# Patient Record
Sex: Female | Born: 1980 | Race: White | Marital: Married
Health system: Southern US, Community
[De-identification: ages and names within clinical notes are randomized; demographics above are authoritative.]

---

## 2017-09-28 ENCOUNTER — Ambulatory Visit: Payer: BC Managed Care – PPO | Admitting: Sports Medicine

## 2017-09-28 ENCOUNTER — Ambulatory Visit
Admission: RE | Admit: 2017-09-28 | Discharge: 2017-09-28 | Disposition: A | Discharge status: Home / Self Care | Payer: BC Managed Care – PPO | Source: Ambulatory Visit | Attending: Sports Medicine | Admitting: Sports Medicine

## 2017-09-28 VITALS — BP 122/78 | Ht 62.0 in | Wt 112.0 lb

## 2017-09-28 DIAGNOSIS — M7071 Other bursitis of hip, right hip: Secondary | ICD-10-CM

## 2017-09-28 DIAGNOSIS — M25551 Pain in right hip: Secondary | ICD-10-CM | POA: Diagnosis not present

## 2017-09-29 ENCOUNTER — Encounter: Payer: Self-pay | Admitting: Sports Medicine

## 2017-09-29 NOTE — Progress Notes (Signed)
   Subjective:    Patient ID: Melanie [name], female    DOB: 02/24/1981, 37 y.o.   MRN: [id number]  HPI chief complaint: Right hip pain  Very pleasant 37 year old female comes in today complaining of right hip pain. She is currently [facility]. While training, she began to notice some pain in the posterior lateral aspect of the right hip which she describes as a "pinching sensation". She received some massage therapy which was helpful but she then began to develop pain more along the lateral hip. Pain at times will radiate into her hamstring. She has been able to continue running. She denies pain in the groin. She denies numbness or tingling. No significant pain at rest but she does endorse pain when lying on her right side. She has had intermittent discomfort in the past which always resolved with massage therapy but this time her symptoms are persisting. No pain at rest.  Past medical history reviewed Medications reviewed Allergies reviewed    Review of Systems    as above Objective:   Physical Exam  Well-developed, fit appearing. No acute distress. Awake alert and oriented 3. Vital signs reviewed  Right hip: Smooth painless hip range of motion with a negative logroll. Negative FABER and negative FADIR. There is minimal tenderness to palpation over the greater trochanteric bursa. No tenderness along the piriformis. Mild weakness with resisted hip abduction on the right. Great strength with resisted hip abduction on the left. Hop testing does reproduce pain but it is along the lateral hip and not in the groin. Neurovascularly intact distally. Walking without a limp.  X-rays of the right hip are unremarkable. Specifically, no obvious stress fracture [facility] ultrasound of the right hip was performed. Limited images were obtained. There is some fluid just posterior to the greater trochanter likely consistent with mild bursitis.       Assessment & Plan:    Right hip pain secondary to bursitis  Patient will start IT band stretching, hip abductor strengthening, and icing after activity (she has already been doing this). I think she is okay to run her upcoming race using pain as her guide. She understands that her symptoms will not resolve prior to the race and she will need to take a little time off to cross train and work on strengthening her hip abductors [facility]. She may use low doses of ibuprofen after exercise as needed. X-ray is reassuring in regards to stress fracture but if symptoms persist or worsen then further diagnostic imaging may be necessary. Gait analysis was not performed today which should also be considered in the future if symptoms do not resolve. She will follow-up for ongoing or recalcitrant issues.

## 2018-04-19 ENCOUNTER — Other Ambulatory Visit: Payer: Self-pay

## 2018-04-19 ENCOUNTER — Ambulatory Visit: Payer: BC Managed Care – PPO | Attending: Family Medicine | Admitting: Physical Therapy

## 2018-04-19 ENCOUNTER — Encounter: Payer: Self-pay | Admitting: Physical Therapy

## 2018-04-19 DIAGNOSIS — M79604 Pain in right leg: Secondary | ICD-10-CM | POA: Diagnosis present

## 2018-04-19 NOTE — Therapy (Signed)
[place], [place], [address] Phone: [phone]   Fax:  [phone]  Physical Therapy Evaluation  Patient Details  Name: Melanie [name] MRN: [id number] Date of Birth: 11-12-1980 Referring Provider (PT): Rhina Brackett, MD   Encounter Date: 04/19/2018  PT End of Session - 04/19/18 2123    Visit Number  1    Number of Visits  4    Date for PT Re-Evaluation  06/14/18    Authorization Type  BCBS    PT Start Time  1324    PT Stop Time  1800    PT Time Calculation (min)  45 min    Activity Tolerance  Patient tolerated treatment well    Behavior During Therapy  [facility] for tasks assessed/performed       History reviewed. No pertinent past medical history.  History reviewed. No pertinent surgical history.  There were no vitals filed for this visit.   Subjective Assessment - 04/19/18 2106    Subjective  37 y/o female with c/o right proximal hamstring region pain. Pt. seen for PT earlier this year (for same issue along with gluteus medius region pain and some SI joint issues) for several visits beginning around last May-improved at the time but pt. reports new onset/exacerbation of symptoms about 1 1/2 months ago. Pain associated with running otherwise no mechanism of injury noted.    Limitations  Sitting;Walking   running   How long can you sit comfortably?  varies, <1 hour    How long can you stand comfortably?  no limitations    How long can you walk comfortably?  varies-pain with running > walking    Patient Stated Goals  Run in marathon this coming January    Currently in Pain?  No/denies   no pain at eval, reports pain 4-5/10 with running right proximal hamstring region        [facility] PT Assessment - 04/19/18 0001      Assessment   Medical Diagnosis  Right hamstring tendinopathy/strain    Referring Provider (PT)  Rhina Brackett, MD    Onset Date/Surgical Date  03/06/18    Hand Dominance  Right     Prior Therapy  --   past PT at another facility over last Sping/Summer     Precautions   Precautions  None      Restrictions   Weight Bearing Restrictions  No      Balance Screen   Has the patient fallen in the past 6 months  No      [place] residence    Living Arrangements  --   Lives with spouse, 1 flight of stairs in home     Prior Function   Level of Independence  Independent with basic ADLs    Vocation  Full time employment    Vocation Requirements  --   teacher     Cognition   Overall Cognitive Status  Within Functional Limits for tasks assessed      Observation/Other Assessments   Focus on Therapeutic Outcomes (FOTO)   34% limited      ROM / Strength   AROM / PROM / Strength  AROM;Strength      AROM   AROM Assessment Site  Knee    Right/Left Knee  Right;Left    Right Knee Extension  0    Right Knee Flexion  137    Left Knee Extension  0  Left Knee Flexion  140      Strength   Strength Assessment Site  Hip;Knee    Right/Left Hip  Right;Left    Right Hip Flexion  5/5    Right Hip Extension  4+/5    Right Hip External Rotation   5/5    Right Hip Internal Rotation  5/5    Right Hip ABduction  4+/5    Left Hip Flexion  5/5    Left Hip Extension  5/5    Left Hip External Rotation  5/5    Left Hip Internal Rotation  5/5    Left Hip ABduction  5/5    Right/Left Knee  Right;Left    Right Knee Flexion  5/5    Right Knee Extension  5/5    Left Knee Flexion  5/5    Left Knee Extension  5/5      Flexibility   Soft Tissue Assessment /Muscle Length  --   Mild hamstring tightness with SLR 80 deg on right     Special Tests   Other special tests  --   [place] and longsitting (+) right ant. innominate rotation               Objective measurements completed on examination: See above findings.      [facility] Adult PT Treatment/Exercise - 04/19/18 0001      Exercises   Exercises  --   brief HEP  instruction prone eccentric, hamstring stretch     Modalities   Modalities  Electrical Stimulation      Electrical Stimulation   Electrical Stimulation Location  right hamstring    Electrical Stimulation Action  TENS   applied via needles   Electrical Stimulation Parameters  2 pps    Electrical Stimulation Goals  Tone;Pain      Manual Therapy   Manual Therapy  Soft tissue mobilization;Muscle Energy Technique    Soft tissue mobilization  STM right hamstring    Muscle Energy Technique  for right anterior innominate rotation       Trigger Point Dry Needling - 04/19/18 2121    Consent Given?  Yes    Education Handout Provided  --   no handout-pt. 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    Time  4    Period  Weeks    Status  New        PT Long Term Goals - 04/19/18 2132      PT LONG TERM GOAL #1   Title  Right hip strength 5/5 for improved ability squatting motions, improved hip stability for improved walking/running tolerance    Time  8    Period  Weeks  Status  New      PT LONG TERM GOAL #2   Title  Improve FOTO to 23% or less impairment    Baseline  34% limited    Time  8    Period  Weeks      PT LONG TERM GOAL #3   Title  Decrease hamstring pain with walking, running 75% or greater to improve tolerance for community ambulation and fitness activities    Time  8    Period   Weeks             Plan - 04/19/18 2124    Clinical Impression Statement  Pt. presents with right proximal hamstring pain consistent with referring dx. hamstring tendinopathy with associated muscle tightness, posterior chain weakness. Pt. also with right anterior innominate rotation which suspect could be contributing factor to ongoing hamstring issues. Pt. would benefit from PT to address current associated functional limitations for mobility.    History and Personal Factors relevant to plan of care:  previous history similar symptoms    Clinical Presentation  Stable    Clinical Decision Making  Low    Rehab Potential  Good    Clinical Impairments Affecting Rehab Potential  previous history symptoms/past tx.     PT Frequency  --   6 visits   PT Duration  8 weeks    PT Treatment/Interventions  ADLs/Self Care Home Management;Cryotherapy;Electrical Stimulation;Ultrasound;Moist Heat;Iontophoresis 6m/ml Dexamethasone;Gait training;Functional mobility training;Therapeutic activities;Patient/family education;Neuromuscular re-education;Manual techniques;Dry needling;Taping    PT Next Visit Plan  for exercises progress eccentrics, gentle hamstring stretches, STM, METs prn for innominate rotation, dry needling, modalities prn    PT Home Exercise Plan  prone hamstring eccentric, hamstring stretch, seated isometric       Patient will benefit from skilled therapeutic intervention in order to improve the following deficits and impairments:  Impaired flexibility, Difficulty walking, Decreased strength, Decreased activity tolerance, Pain, Impaired tone, Decreased endurance  Visit Diagnosis: Pain in right leg     Problem List There are no active problems to display for this patient.   CBeaulah Dinning PT, DPT 04/19/18 9:39 PM  [facility] [address]Phone: [phone]  Fax:  [phone] Name: Melanie RanaMRN: [id number] of Birth: 4January 30, 1982

## 2018-05-10 ENCOUNTER — Ambulatory Visit: Payer: BC Managed Care – PPO | Attending: Family Medicine | Admitting: Physical Therapy

## 2018-05-10 ENCOUNTER — Encounter: Payer: Self-pay | Admitting: Physical Therapy

## 2018-05-10 DIAGNOSIS — M79604 Pain in right leg: Secondary | ICD-10-CM

## 2018-05-10 NOTE — Therapy (Signed)
[facility] [address] [place], [place], [address] Phone: [phone]   Fax:  [phone]  Physical Therapy Treatment  Patient Details  Name: Melanie [name] MRN: [id number] Date of Birth: 03/06/1981 Referring Provider (PT): Eula Listenominic McKinley, MD   Encounter Date: 05/10/2018  PT End of Session - 05/10/18 1745    Visit Number  2    Number of Visits  4    Date for PT Re-Evaluation  06/14/18    Authorization Type  BCBS    PT Start Time  1717    PT Stop Time  1802    PT Time Calculation (min)  45 min    Activity Tolerance  Patient tolerated treatment well    Behavior During Therapy  [facility] for tasks assessed/performed       History reviewed. No pertinent past medical history.  History reviewed. No pertinent surgical history.  There were no vitals filed for this visit.  Subjective Assessment - 05/10/18 1743    Subjective  Pt. returns for first follow up since initial eval. She reports tx. at eval helped but ran half marathon with some subsequent symptom exacerbation though pain more with sitting than running afterward.    Currently in Pain?  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                       [facility] Adult PT Treatment/Exercise - 05/10/18 0001      Exercises   Exercises  Knee/Hip      Knee/Hip Exercises: Stretches   Passive Hamstring Stretch  Right;3 reps;30 seconds      Knee/Hip Exercises: Supine   Bridges with Ball Squeeze  20 reps   pillow squeeze instead of ball   Knee Flexion  Both;1 set;20 reps   with 65 cm  P-ball   Other Supine Knee/Hip Exercises  supine manual piriformis stretch 3x30 sec      Electrical Stimulation   Electrical Stimulation Location  right hamstring    Electrical Stimulation Action  --   TENS 100 pps applied via needles   Electrical Stimulation Parameters  100 pps    Electrical Stimulation Goals  Tone;Pain      Manual Therapy   Manual Therapy  Soft tissue mobilization;Muscle Energy Technique    Soft tissue mobilization  STM righ hamstring    Muscle Energy Technique  for right anterior innominate rotation 3x5       Trigger Point Dry Needling - 05/10/18 1806    Consent Given?  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    Time  4    Period  Weeks    Status  New        PT Long Term Goals - 04/19/18 2132      PT LONG TERM GOAL #1   Title  Right hip strength 5/5 for improved ability squatting motions, improved hip stability for improved walking/running tolerance    Time  8    Period  Weeks    Status  New      PT LONG TERM GOAL #2   Title  Improve FOTO to 23% or less impairment    Baseline  34% limited    Time  8    Period  Weeks      PT LONG TERM GOAL #3   Title  Decrease hamstring pain with walking, running 75% or greater to improve tolerance for community ambulation and fitness activities    Time  8    Period  Weeks            Plan - 05/10/18 1747    Clinical Impression  Statement  Good response to initial tx. but some setback as noted in subjective with symptom exacerbation. Presentation consistent with hamstring tendinopathy-significant hamstring tightness noted today. P.t would benefit from continued PT for further progress to address functional mobility limitations.    PT Frequency  --   6 visits   PT Duration  8 weeks    PT Treatment/Interventions  ADLs/Self Care Home Management;Cryotherapy;Electrical Stimulation;Ultrasound;Moist Heat;Iontophoresis 4mg /ml Dexamethasone;Gait training;Functional mobility training;Therapeutic activities;Patient/family education;Neuromuscular re-education;Manual techniques;Dry needling;Taping    PT Next Visit Plan  for exercises progress eccentrics, gentle hamstring stretches, STM, METs prn for innominate rotation, dry needling, modalities prn    PT Home Exercise Plan  prone hamstring eccentric, hamstring stretch, seated isometric       Patient will benefit from skilled therapeutic intervention in order to improve the following deficits and impairments:  Impaired flexibility, Difficulty walking, Decreased strength, Decreased activity tolerance, Pain, Impaired tone, Decreased endurance  Visit Diagnosis: Pain in right leg     Problem List There are no active problems to display for this patient.   Lazarus Gowda, PT, DPT 05/10/18 6:11 PM  [facility] [address] [place], [place], [address] Phone: [phone]   Fax:  [phone]  Name: Melanie [name] MRN: [id number] Date of Birth: Jun 12, 1981

## 2018-05-24 ENCOUNTER — Ambulatory Visit: Payer: BC Managed Care – PPO | Attending: Family Medicine | Admitting: Physical Therapy

## 2018-05-24 ENCOUNTER — Encounter: Payer: Self-pay | Admitting: Physical Therapy

## 2018-05-24 DIAGNOSIS — M79604 Pain in right leg: Secondary | ICD-10-CM | POA: Diagnosis not present

## 2018-05-24 NOTE — Therapy (Signed)
[place], [place], [address] Phone: [phone]   Fax:  [phone]  Physical Therapy Treatment  Patient Details  Name: Melanie [name] MRN: [id number] Date of Birth: 13-Aug-1980 Referring Provider (PT): Rhina Brackett, MD   Encounter Date: 05/24/2018  PT End of Session - 05/24/18 2058    Visit Number  3    Number of Visits  4    Date for PT Re-Evaluation  06/14/18    Authorization Type  BCBS    PT Start Time  1722    PT Stop Time  1803    PT Time Calculation (min)  41 min    Activity Tolerance  Patient tolerated treatment well    Behavior During Therapy  [facility] for tasks assessed/performed       History reviewed. No pertinent past medical history.  History reviewed. No pertinent surgical history.  There were no vitals filed for this visit.  Subjective Assessment - 05/24/18 2054    Subjective  Had some soreness over the weekend with running but feels tx. helping, no pain pre-tx.    Currently in Pain?  No/denies    Pain Score  0-No pain         [facility] PT Assessment - 05/24/18 0001      Strength   Right Hip Flexion  5/5    Right Hip Extension  4+/5    Right Hip External Rotation   5/5    Right Hip Internal Rotation  5/5    Right Hip ABduction  5/5    Left Hip Flexion  5/5    Left Hip Extension  5/5    Left Hip External Rotation  5/5    Left Hip Internal Rotation  5/5    Left Hip ABduction  5/5    Right Knee Flexion  5/5    Right Knee Extension  5/5    Left Knee Flexion  5/5    Left Knee Extension  5/5                   [facility] Adult PT Treatment/Exercise - 05/24/18 0001      Knee/Hip Exercises: Stretches   Passive Hamstring Stretch  Right;3 reps;30 seconds      Knee/Hip Exercises: Supine   Bridges with [employer]  Both;20 reps    Other Supine Knee/Hip Exercises  clamshell 2x10 blue Theraband      Acupuncturist Location  --   Right  hamstring   Electrical Stimulation Action  TENS applied via needles    Electrical Stimulation Parameters  100 pps x 10 min    Electrical Stimulation Goals  Tone;Pain      Manual Therapy   Manual Therapy  Soft tissue mobilization;Muscle Energy Technique    Soft tissue mobilization  STM right hamstring    Muscle Energy Technique  for right anterior innominate rotation correction       Trigger Point Dry Needling - 05/24/18 2055    Consent Given?  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    Baseline  symptoms variable but progressing    Time  4    Period  Weeks    Status  On-going        PT Long Term Goals - 05/24/18 2102      PT LONG TERM GOAL #1   Title  Right hip strength 5/5 for improved ability squatting motions, improved hip stability for improved walking/running tolerance    Baseline  ext 4+/5 otherwise met    Time  8    Period  Weeks    Status  Partially Met      PT LONG TERM GOAL #2   Title  Improve FOTO to 23% or less impairment    Baseline  34% limited at eval, not retested    Time  8    Period  Weeks      PT LONG TERM GOAL #3   Title  Decrease hamstring pain with walking, running 75% or greater to improve tolerance for community ambulation and fitness activities    Baseline  ongoing    Time  8    Period  Weeks    Status  On-going            Plan - 05/24/18 2058    Clinical Impression  Statement  Continues with innominate rotation and intermittent proximal hamstring pain consistent with tendinopathy and hamstring origin but pain and functional status for mobility improving from baseline.    PT Frequency  --   6 visits   PT Duration  8 weeks    PT Treatment/Interventions  ADLs/Self Care Home Management;Cryotherapy;Electrical Stimulation;Ultrasound;Moist Heat;Iontophoresis 62m/ml Dexamethasone;Gait training;Functional mobility training;Therapeutic activities;Patient/family education;Neuromuscular re-education;Manual techniques;Dry needling;Taping    PT Next Visit Plan  for exercises progress eccentrics, gentle hamstring stretches, STM, METs prn for innominate rotation, dry needling, modalities prn    PT Home Exercise Plan  prone hamstring eccentric, hamstring stretch, seated isometric       Patient will benefit from skilled therapeutic intervention in order to improve the following deficits and impairments:  Impaired flexibility, Difficulty walking, Decreased strength, Decreased activity tolerance, Pain, Impaired tone, Decreased endurance  Visit Diagnosis: Pain in right leg     Problem List There are no active problems to display for this patient.   CBeaulah Dinning PT, DPT 05/24/18 9:03 PM  [facility] [address]Phone: [phone]  Fax:  [phone] Name: Melanie GillihanMRN: [id number] of Birth: 408/11/1980

## 2018-06-07 ENCOUNTER — Encounter: Payer: Self-pay | Admitting: Physical Therapy

## 2018-06-07 ENCOUNTER — Ambulatory Visit: Payer: BC Managed Care – PPO | Admitting: Physical Therapy

## 2018-06-07 DIAGNOSIS — M79604 Pain in right leg: Secondary | ICD-10-CM

## 2018-06-07 NOTE — Therapy (Signed)
[place], [place], [address] Phone: [phone]   Fax:  [phone]  Physical Therapy Treatment  Patient Details  Name: Melanie [name] MRN: [id number] Date of Birth: [birth year] Referring Provider (PT): Rhina Brackett, MD   Encounter Date: 06/07/2018  PT End of Session - 06/07/18 2053    Visit Number  4    Number of Visits  6    Date for PT Re-Evaluation  06/14/18    Authorization Type  [employer]    PT Start Time  1717    PT Stop Time  1806    PT Time Calculation (min)  49 min    Activity Tolerance  Patient tolerated treatment well    Behavior During Therapy  [facility] for tasks assessed/performed       History reviewed. No pertinent past medical history.  History reviewed. No pertinent surgical history.  There were no vitals filed for this visit.  Subjective Assessment - 06/07/18 2044    Subjective  Pt. reports right hamstring is improving though still notes soreness with prolonged running or sitting. Pt. has noted some right hip flexor region pain with running since last week. No overt mechanism of onset noted.    Currently in Pain?  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, hospital, difficulty running for fitness         [facility] PT Assessment - 06/07/18 0001      Observation/Other Assessments   Focus on Therapeutic Outcomes (FOTO)   25% limited      Strength   Right Hip Flexion  5/5    Right Hip Extension  5/5    Right Hip External Rotation   5/5    Right Hip Internal Rotation  5/5    Right Hip ABduction   5/5    Left Hip Flexion  5/5    Left Hip Extension  5/5    Left Hip External Rotation  5/5    Left Hip Internal Rotation  5/5    Left Hip ABduction  5/5    Right Knee Flexion  5/5    Right Knee Extension  5/5    Left Knee Flexion  5/5    Left Knee Extension  5/5                   [facility] Adult PT Treatment/Exercise - 06/07/18 0001      Knee/Hip Exercises: Stretches   Passive Hamstring Stretch  Right;3 reps;30 seconds    Hip Flexor Stretch  Right;3 reps;30 seconds      Knee/Hip Exercises: Supine   Hip Adduction Isometric  20 reps    Other Supine Knee/Hip Exercises  clamshell blue x 20      Electrical Stimulation   Electrical Stimulation Location  Right hamstring    Electrical Stimulation Action  TENS   applied via needles   Electrical Stimulation Parameters  100 pps x 10 min    Electrical Stimulation Goals  Tone;Pain  Manual Therapy   Manual Therapy  Soft tissue mobilization;Muscle Energy Technique    Soft tissue mobilization  STM right hamstring    Muscle Energy Technique  for right anterior innominate rotation 5x2 for 5 sec holds             PT Education - 06/07/18 2052    Education Details  POC, hip/muscle anatomy    Person(s) Educated  Patient    Methods  Explanation;Demonstration    Comprehension  Verbalized understanding       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    Baseline  met    Time  4    Period  Weeks    Status  Achieved        PT Long Term Goals - 06/07/18 2057      PT LONG TERM GOAL #1   Title  Right hip strength 5/5 for improved ability squatting motions, improved hip stability for improved walking/running tolerance    Baseline  5/5    Time  8    Period   Weeks    Status  Achieved      PT LONG TERM GOAL #2   Title  Improve FOTO to 23% or less impairment    Baseline  25% limited    Time  8    Period  Weeks    Status  Achieved      PT LONG TERM GOAL #3   Title  Decrease hamstring pain with walking, running 75% or greater to improve tolerance for community ambulation and fitness activities    Time  8    Period  Weeks    Status  On-going            Plan - 06/07/18 2053    Clinical Impression Statement  Improving with hamstring pain and associated functional gains for sitting tolerance and activities such as running. 9% improvement in FOTO score from baseline. Continues with innominate rotation (tendency right anterior innominate) and today presents with hip flexor region pain consistent with muscle strain. Per discusssion with pt. she will see how things go after today with HEP and return if needed pending future symptoms after giving HEP a few weeks.    Clinical Presentation  Evolving    Clinical Presentation due to:  new onset anterior hip pain symptoms    Clinical Decision Making  Low    Rehab Potential  Good    Clinical Impairments Affecting Rehab Potential  previous history symptoms/past tx.     PT Frequency  --   6 visits   PT Duration  8 weeks    PT Treatment/Interventions  ADLs/Self Care Home Management;Cryotherapy;Electrical Stimulation;Ultrasound;Moist Heat;Iontophoresis 63m/ml Dexamethasone;Gait training;Functional mobility training;Therapeutic activities;Patient/family education;Neuromuscular re-education;Manual techniques;Dry needling;Taping    PT Next Visit Plan  for exercises progress eccentrics, gentle hamstring stretches, STM, METs prn for innominate rotation, dry needling, modalities prn    PT Home Exercise Plan  prone hamstring eccentric, hamstring stretch, seated isometric, hip flexor stretch    Consulted and Agree with Plan of Care  Patient  Patient will benefit from skilled therapeutic intervention in  order to improve the following deficits and impairments:  Impaired flexibility, Difficulty walking, Decreased strength, Decreased activity tolerance, Pain, Impaired tone, Decreased endurance  Visit Diagnosis: Pain in right leg     Problem List There are no active problems to display for this patient.   Beaulah Dinning, PT, DPT 06/07/18 8:59 PM  [facility] [address] [place], [place], [address] Phone: [phone]   Fax:  [phone]  Name: Melanie [name] MRN: [id number] Date of Birth: 1981-04-17

## 2018-06-24 DIAGNOSIS — Z0000 Encounter for general adult medical examination without abnormal findings: Secondary | ICD-10-CM | POA: Diagnosis not present

## 2018-06-24 DIAGNOSIS — Z131 Encounter for screening for diabetes mellitus: Secondary | ICD-10-CM | POA: Diagnosis not present

## 2018-06-24 DIAGNOSIS — Z3041 Encounter for surveillance of contraceptive pills: Secondary | ICD-10-CM | POA: Diagnosis not present

## 2018-06-24 DIAGNOSIS — Z1151 Encounter for screening for human papillomavirus (HPV): Secondary | ICD-10-CM | POA: Diagnosis not present

## 2018-06-24 DIAGNOSIS — Z130 Encounter for screening for diseases of the blood and blood-forming organs and certain disorders involving the immune mechanism: Secondary | ICD-10-CM | POA: Diagnosis not present

## 2018-06-24 DIAGNOSIS — Z1329 Encounter for screening for other suspected endocrine disorder: Secondary | ICD-10-CM | POA: Diagnosis not present

## 2018-06-24 DIAGNOSIS — Z13220 Encounter for screening for lipoid disorders: Secondary | ICD-10-CM | POA: Diagnosis not present

## 2018-06-24 DIAGNOSIS — Z01419 Encounter for gynecological examination (general) (routine) without abnormal findings: Secondary | ICD-10-CM | POA: Diagnosis not present

## 2018-06-24 DIAGNOSIS — Z6821 Body mass index (BMI) 21.0-21.9, adult: Secondary | ICD-10-CM | POA: Diagnosis not present

## 2018-06-24 DIAGNOSIS — Z113 Encounter for screening for infections with a predominantly sexual mode of transmission: Secondary | ICD-10-CM | POA: Diagnosis not present

## 2018-07-12 NOTE — Therapy (Signed)
[place], [place], [address] Phone: [phone]   Fax:  [phone]  Physical Therapy Treatment/Discharge Summary  Patient Details  Name: Melanie [name] MRN: [id number] Date of Birth: Mar 25, 1981 Referring Provider (PT): Rhina Brackett, MD   Encounter Date: 06/07/2018    History reviewed. No pertinent past medical history.  History reviewed. No pertinent surgical history.  There were no vitals filed for this visit.                              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    Baseline  met    Time  4    Period  Weeks    Status  Achieved        PT Long Term Goals - 06/07/18 2057      PT LONG TERM GOAL #1   Title  Right hip strength 5/5 for improved ability squatting motions, improved hip stability for improved walking/running tolerance    Baseline  5/5    Time  8    Period  Weeks    Status  Achieved      PT LONG TERM GOAL #2   Title  Improve FOTO to 23% or less impairment    Baseline  25% limited    Time  8    Period  Weeks    Status  Achieved      PT LONG TERM GOAL #3   Title  Decrease hamstring pain with walking, running 75% or greater to improve tolerance for community ambulation and fitness activities    Time  8    Period  Weeks    Status  On-going              Patient will benefit from skilled therapeutic intervention in order to improve the following deficits and impairments:  Impaired flexibility, Difficulty walking, Decreased strength, Decreased activity tolerance, Pain, Impaired tone, Decreased endurance  Visit Diagnosis: Pain in right leg     Problem List There  are no active problems to display for this patient.      PHYSICAL THERAPY DISCHARGE SUMMARY  Visits from Start of Care: 4  Current functional level related to goals / functional outcomes: Patient did not return after last visit 06/07/18 for further therapy.   Remaining deficits: Unknown   Education / Equipment: NA Plan:                                                    Patient goals were not met. Patient is being discharged due to not returning since the last visit.  ?????          Beaulah Dinning, PT, DPT 07/12/18 1:41 PM     [facility] [address] [place], [place], [address] Phone: [phone]   Fax:  [phone]  Name: Ander Purpura  Dace MRN: [id number] Date of Birth: 08-16-1980

## 2019-02-08 DIAGNOSIS — M7611 Psoas tendinitis, right hip: Secondary | ICD-10-CM | POA: Diagnosis not present

## 2019-02-08 DIAGNOSIS — M9904 Segmental and somatic dysfunction of sacral region: Secondary | ICD-10-CM | POA: Diagnosis not present

## 2019-02-08 DIAGNOSIS — M9905 Segmental and somatic dysfunction of pelvic region: Secondary | ICD-10-CM | POA: Diagnosis not present

## 2019-02-08 DIAGNOSIS — M9903 Segmental and somatic dysfunction of lumbar region: Secondary | ICD-10-CM | POA: Diagnosis not present

## 2019-02-14 DIAGNOSIS — M9903 Segmental and somatic dysfunction of lumbar region: Secondary | ICD-10-CM | POA: Diagnosis not present

## 2019-02-14 DIAGNOSIS — M7611 Psoas tendinitis, right hip: Secondary | ICD-10-CM | POA: Diagnosis not present

## 2019-02-14 DIAGNOSIS — M9904 Segmental and somatic dysfunction of sacral region: Secondary | ICD-10-CM | POA: Diagnosis not present

## 2019-02-14 DIAGNOSIS — M9905 Segmental and somatic dysfunction of pelvic region: Secondary | ICD-10-CM | POA: Diagnosis not present

## 2019-02-21 DIAGNOSIS — M9905 Segmental and somatic dysfunction of pelvic region: Secondary | ICD-10-CM | POA: Diagnosis not present

## 2019-02-21 DIAGNOSIS — M9903 Segmental and somatic dysfunction of lumbar region: Secondary | ICD-10-CM | POA: Diagnosis not present

## 2019-02-21 DIAGNOSIS — M9904 Segmental and somatic dysfunction of sacral region: Secondary | ICD-10-CM | POA: Diagnosis not present

## 2019-02-21 DIAGNOSIS — M7611 Psoas tendinitis, right hip: Secondary | ICD-10-CM | POA: Diagnosis not present

## 2019-03-01 DIAGNOSIS — M9904 Segmental and somatic dysfunction of sacral region: Secondary | ICD-10-CM | POA: Diagnosis not present

## 2019-03-01 DIAGNOSIS — M9903 Segmental and somatic dysfunction of lumbar region: Secondary | ICD-10-CM | POA: Diagnosis not present

## 2019-03-01 DIAGNOSIS — M7611 Psoas tendinitis, right hip: Secondary | ICD-10-CM | POA: Diagnosis not present

## 2019-03-01 DIAGNOSIS — M9905 Segmental and somatic dysfunction of pelvic region: Secondary | ICD-10-CM | POA: Diagnosis not present

## 2019-03-13 DIAGNOSIS — M9903 Segmental and somatic dysfunction of lumbar region: Secondary | ICD-10-CM | POA: Diagnosis not present

## 2019-03-13 DIAGNOSIS — M7611 Psoas tendinitis, right hip: Secondary | ICD-10-CM | POA: Diagnosis not present

## 2019-03-13 DIAGNOSIS — M9905 Segmental and somatic dysfunction of pelvic region: Secondary | ICD-10-CM | POA: Diagnosis not present

## 2019-03-13 DIAGNOSIS — M9904 Segmental and somatic dysfunction of sacral region: Secondary | ICD-10-CM | POA: Diagnosis not present

## 2019-03-28 DIAGNOSIS — M9904 Segmental and somatic dysfunction of sacral region: Secondary | ICD-10-CM | POA: Diagnosis not present

## 2019-03-28 DIAGNOSIS — M7611 Psoas tendinitis, right hip: Secondary | ICD-10-CM | POA: Diagnosis not present

## 2019-03-28 DIAGNOSIS — M9903 Segmental and somatic dysfunction of lumbar region: Secondary | ICD-10-CM | POA: Diagnosis not present

## 2019-03-28 DIAGNOSIS — M9905 Segmental and somatic dysfunction of pelvic region: Secondary | ICD-10-CM | POA: Diagnosis not present

## 2019-04-18 DIAGNOSIS — M9905 Segmental and somatic dysfunction of pelvic region: Secondary | ICD-10-CM | POA: Diagnosis not present

## 2019-04-18 DIAGNOSIS — M9904 Segmental and somatic dysfunction of sacral region: Secondary | ICD-10-CM | POA: Diagnosis not present

## 2019-04-18 DIAGNOSIS — M7611 Psoas tendinitis, right hip: Secondary | ICD-10-CM | POA: Diagnosis not present

## 2019-04-18 DIAGNOSIS — M9903 Segmental and somatic dysfunction of lumbar region: Secondary | ICD-10-CM | POA: Diagnosis not present

## 2019-05-09 DIAGNOSIS — M9904 Segmental and somatic dysfunction of sacral region: Secondary | ICD-10-CM | POA: Diagnosis not present

## 2019-05-09 DIAGNOSIS — M7611 Psoas tendinitis, right hip: Secondary | ICD-10-CM | POA: Diagnosis not present

## 2019-05-09 DIAGNOSIS — M9905 Segmental and somatic dysfunction of pelvic region: Secondary | ICD-10-CM | POA: Diagnosis not present

## 2019-05-09 DIAGNOSIS — M9903 Segmental and somatic dysfunction of lumbar region: Secondary | ICD-10-CM | POA: Diagnosis not present

## 2019-06-08 DIAGNOSIS — M9904 Segmental and somatic dysfunction of sacral region: Secondary | ICD-10-CM | POA: Diagnosis not present

## 2019-06-08 DIAGNOSIS — M9903 Segmental and somatic dysfunction of lumbar region: Secondary | ICD-10-CM | POA: Diagnosis not present

## 2019-06-08 DIAGNOSIS — M7611 Psoas tendinitis, right hip: Secondary | ICD-10-CM | POA: Diagnosis not present

## 2019-06-08 DIAGNOSIS — M9905 Segmental and somatic dysfunction of pelvic region: Secondary | ICD-10-CM | POA: Diagnosis not present

## 2019-07-04 DIAGNOSIS — Z20828 Contact with and (suspected) exposure to other viral communicable diseases: Secondary | ICD-10-CM | POA: Diagnosis not present

## 2019-07-18 DIAGNOSIS — M9903 Segmental and somatic dysfunction of lumbar region: Secondary | ICD-10-CM | POA: Diagnosis not present

## 2019-07-18 DIAGNOSIS — M9905 Segmental and somatic dysfunction of pelvic region: Secondary | ICD-10-CM | POA: Diagnosis not present

## 2019-07-18 DIAGNOSIS — M9904 Segmental and somatic dysfunction of sacral region: Secondary | ICD-10-CM | POA: Diagnosis not present

## 2019-07-18 DIAGNOSIS — M7611 Psoas tendinitis, right hip: Secondary | ICD-10-CM | POA: Diagnosis not present

## 2019-08-15 DIAGNOSIS — M9904 Segmental and somatic dysfunction of sacral region: Secondary | ICD-10-CM | POA: Diagnosis not present

## 2019-08-15 DIAGNOSIS — M9905 Segmental and somatic dysfunction of pelvic region: Secondary | ICD-10-CM | POA: Diagnosis not present

## 2019-08-15 DIAGNOSIS — M7611 Psoas tendinitis, right hip: Secondary | ICD-10-CM | POA: Diagnosis not present

## 2019-08-15 DIAGNOSIS — M9903 Segmental and somatic dysfunction of lumbar region: Secondary | ICD-10-CM | POA: Diagnosis not present

## 2019-09-26 DIAGNOSIS — M9903 Segmental and somatic dysfunction of lumbar region: Secondary | ICD-10-CM | POA: Diagnosis not present

## 2019-09-26 DIAGNOSIS — M9904 Segmental and somatic dysfunction of sacral region: Secondary | ICD-10-CM | POA: Diagnosis not present

## 2019-09-26 DIAGNOSIS — M7611 Psoas tendinitis, right hip: Secondary | ICD-10-CM | POA: Diagnosis not present

## 2019-09-26 DIAGNOSIS — M9905 Segmental and somatic dysfunction of pelvic region: Secondary | ICD-10-CM | POA: Diagnosis not present

## 2019-11-16 DIAGNOSIS — Z1231 Encounter for screening mammogram for malignant neoplasm of breast: Secondary | ICD-10-CM | POA: Diagnosis not present

## 2019-11-16 DIAGNOSIS — Z01419 Encounter for gynecological examination (general) (routine) without abnormal findings: Secondary | ICD-10-CM | POA: Diagnosis not present

## 2019-11-16 DIAGNOSIS — Z6822 Body mass index (BMI) 22.0-22.9, adult: Secondary | ICD-10-CM | POA: Diagnosis not present

## 2020-09-10 ENCOUNTER — Other Ambulatory Visit: Payer: Self-pay

## 2020-09-10 ENCOUNTER — Ambulatory Visit (INDEPENDENT_AMBULATORY_CARE_PROVIDER_SITE_OTHER): Payer: BC Managed Care – PPO

## 2020-09-10 ENCOUNTER — Ambulatory Visit (INDEPENDENT_AMBULATORY_CARE_PROVIDER_SITE_OTHER): Payer: BC Managed Care – PPO | Admitting: Family Medicine

## 2020-09-10 ENCOUNTER — Encounter: Payer: Self-pay | Admitting: Family Medicine

## 2020-09-10 VITALS — BP 148/98 | HR 67 | Ht 62.0 in | Wt 112.0 lb

## 2020-09-10 DIAGNOSIS — M545 Low back pain, unspecified: Secondary | ICD-10-CM

## 2020-09-10 DIAGNOSIS — G8929 Other chronic pain: Secondary | ICD-10-CM

## 2020-09-10 DIAGNOSIS — M2170 Unequal limb length (acquired), unspecified site: Secondary | ICD-10-CM | POA: Diagnosis not present

## 2020-09-10 DIAGNOSIS — M999 Biomechanical lesion, unspecified: Secondary | ICD-10-CM | POA: Diagnosis not present

## 2020-09-10 DIAGNOSIS — M25551 Pain in right hip: Secondary | ICD-10-CM | POA: Diagnosis not present

## 2020-09-10 NOTE — Assessment & Plan Note (Signed)

## 2020-09-10 NOTE — Assessment & Plan Note (Signed)
Leg length discrepancy noted.  Very mild overall, heel lift given for the left leg

## 2020-09-10 NOTE — Patient Instructions (Addendum)
Xray today Glute strength L heel lift Shoes: [employer] Carbon X, New Balance 990, Brooks Adrenaline Vit D 2000IU daily Ice 20 min 2x a day See me in 4 weeks

## 2020-09-10 NOTE — Assessment & Plan Note (Signed)
The patient is well.  Discussed with patient that some of her shoes, discrepancy with patient's right heel lift on the contralateral side.  We discussed different things and attempted osteopathic manipulation with some improvement.  Discussed with patient to continue working with her physical therapist and massage therapist.  Patient can start running again but we discussed different shoes that may be more appropriate as well.  Given exercises.  Follow-up again 4 to 8 weeks.

## 2020-09-10 NOTE — Progress Notes (Signed)
[name] [address] [place] [address] Phone: [phone] Subjective:   Bruce Donath, am serving as a scribe for Dr. Antoine Primas. This visit occurred during the SARS-CoV-2 public health emergency.  Safety protocols were in place, including screening questions prior to the visit, additional usage of staff PPE, and extensive cleaning of exam room while observing appropriate contact time as indicated for disinfecting solutions.   I'm seeing this patient by the request  of:  Patient, No Pcp Per  CC: Back and hip pain  [id number]  Melanie [name] is a 40 y.o. female coming in with complaint of R hip pain since 2019. Pain in proximal hamstring, R groin and R lumbar spine pain. Feels tight on entire R side up to R shoulder. Tightness occurs all the time but when increasing mileage her pain will increase. Did just do a 1/2 marathon. Uses Peloton and does not have pain with cycling. Pain is dull and burning. Did see Dr. Margaretha Sheffield in 2019 for posterior lateral R hip pain with training for [facility]. Patient always feels some pain on R side. Has tried physical therapy, dry needling. Does not any medicine for pain.  Patient to be able to run out regularly.  Unable to do so secondary to the pain radiculitis.     Seen physical therapy in 2019  No past medical history on file. No past surgical history on file. Social History   Socioeconomic History  . Marital status: Married    Spouse name: Not on file  . Number of children: Not on file  . Years of education: Not on file  . Highest education level: Not on file  Occupational History  . Not on file  Tobacco Use  . Smoking status: Not on file  . Smokeless tobacco: Not on file  Substance and Sexual Activity  . Alcohol use: Not on file  . Drug use: Not on file  . Sexual activity: Not on file  Other Topics Concern  . Not on file  Social History Narrative  . Not on file   Social Determinants  of Health   Financial Resource Strain: Not on file  Food Insecurity: Not on file  Transportation Needs: Not on file  Physical Activity: Not on file  Stress: Not on file  Social Connections: Not on file   No Known Allergies No family history on file.  Current Outpatient Medications (Endocrine & Metabolic):  .  LO LOESTRIN FE 1 MG-10 MCG / 10 MCG tablet, TK 1 T PO D        Reviewed prior external information including notes and imaging from  primary care provider As well as notes that were available from care everywhere and other healthcare systems.  Past medical history, social, surgical and family history all reviewed in electronic medical record.  No pertanent information unless stated regarding to the chief complaint.   Review of Systems:  No headache, visual changes, nausea, vomiting, diarrhea, constipation, dizziness, abdominal pain, skin rash, fevers, chills, night sweats, weight loss, swollen lymph nodes, body aches, joint swelling, chest pain, shortness of breath, mood changes. POSITIVE muscle aches  Objective  Blood pressure (!) 148/98, pulse 67, height 5\' 2"  (1.575 m), weight 112 lb (50.8 kg), last menstrual period 09/10/2020, SpO2 99 %.   General: No apparent distress alert and oriented x3 mood and affect normal, dressed appropriately.  HEENT: Pupils equal, extraocular movements intact  Respiratory: Patient's speak in full sentences and does not appear  short of breath  Cardiovascular: No lower extremity edema, non tender, no erythema  Gait ambulation noted the patient does have some mild weakness noted with walking of the right hip noted. MSK: Patient does have some tenderness over the right sacroiliac joint.  Very mild tightness with FABER test.  Negative fulcrum test as well as negative grind test noted.  Patient's pain seems to be more in the posterior aspect and the anterior aspect the patient does have some mild groin pain with adduction.  Contralateral hip  unremarkable  Osteopathic findings T8-9 extended rotated and side bent right L2 flexed rotated and side bent right Sacrum right on right     Impression and Recommendations:     The above documentation has been reviewed and is accurate and complete Judi Saa, DO

## 2020-10-09 ENCOUNTER — Ambulatory Visit (INDEPENDENT_AMBULATORY_CARE_PROVIDER_SITE_OTHER): Payer: BC Managed Care – PPO | Admitting: Family Medicine

## 2020-10-09 ENCOUNTER — Encounter: Payer: Self-pay | Admitting: Family Medicine

## 2020-10-09 ENCOUNTER — Other Ambulatory Visit: Payer: Self-pay

## 2020-10-09 VITALS — BP 122/92 | HR 60 | Ht 62.0 in | Wt 126.0 lb

## 2020-10-09 DIAGNOSIS — M999 Biomechanical lesion, unspecified: Secondary | ICD-10-CM | POA: Diagnosis not present

## 2020-10-09 DIAGNOSIS — M9902 Segmental and somatic dysfunction of thoracic region: Secondary | ICD-10-CM

## 2020-10-09 DIAGNOSIS — M9904 Segmental and somatic dysfunction of sacral region: Secondary | ICD-10-CM | POA: Diagnosis not present

## 2020-10-09 DIAGNOSIS — M9903 Segmental and somatic dysfunction of lumbar region: Secondary | ICD-10-CM | POA: Diagnosis not present

## 2020-10-09 DIAGNOSIS — M25551 Pain in right hip: Secondary | ICD-10-CM

## 2020-10-09 NOTE — Assessment & Plan Note (Signed)
Right hip pain seems to be more in the sacroiliac joint.  Did have significant good range of motion.  Responded very well to osteopathic manipulation today.  Discussed posture and ergonomics.  Patient will start a running progression very slowly.  No need for any changes in medications.  Follow-up again in 6 to 8 weeks

## 2020-10-09 NOTE — Progress Notes (Signed)
[name] [address] [place] [address] Phone: [phone] Subjective:   Melanie [name], am serving as a scribe for Dr. Antoine Primas. This visit occurred during the SARS-CoV-2 public health emergency.  Safety protocols were in place, including screening questions prior to the visit, additional usage of staff PPE, and extensive cleaning of exam room while observing appropriate contact time as indicated for disinfecting solutions.   I'm seeing this patient by the request  of:  Patient, No Pcp Per (Inactive)  CC: Neck and back pain follow-up  [id number]  Melanie [name] is a 40 y.o. female coming in with complaint of back and neck pain. OMT 09/10/2020. Patient states that she has not been running as much as normal. Uses Peloton instead of running.  Patient states that she has been making some improvement.  Medications patient has been prescribed: None          Reviewed prior external information including notes and imaging from previsou exam, outside providers and external EMR if available.   As well as notes that were available from care everywhere and other healthcare systems.  Past medical history, social, surgical and family history all reviewed in electronic medical record.  No pertanent information unless stated regarding to the chief complaint.   No past medical history on file.  No Known Allergies   Review of Systems:  No headache, visual changes, nausea, vomiting, diarrhea, constipation, dizziness, abdominal pain, skin rash, fevers, chills, night sweats, weight loss, swollen lymph nodes, body aches, joint swelling, chest pain, shortness of breath, mood changes. POSITIVE muscle aches  Objective  Blood pressure (!) 122/92, pulse 60, height 5\' 2"  (1.575 m), weight 126 lb (57.2 kg), last menstrual period 09/10/2020, SpO2 99 %.   General: No apparent distress alert and oriented x3 mood and affect normal, dressed appropriately.   HEENT: Pupils equal, extraocular movements intact  Respiratory: Patient's speak in full sentences and does not appear short of breath  Cardiovascular: No lower extremity edema, non tender, no erythema  Gait normal with good balance and coordination.  MSK:  Non tender with full range of motion and good stability and symmetric strength and tone of shoulders, elbows, wrist, hip, knee and ankles bilaterally.  Back -  Patient's low back does have some very mild tightness more around the right sacroiliac joint.  Some tenderness to palpation in the paraspinal musculature.  Leg length discrepancy still noted.  Osteopathic findings  T3 extended rotated and side bent right inhaled rib T7 extended rotated and side bent left L2 flexed rotated and side bent right Sacrum right on right       Assessment and Plan:  Right hip pain Right hip pain seems to be more in the sacroiliac joint.  Did have significant good range of motion.  Responded very well to osteopathic manipulation today.  Discussed posture and ergonomics.  Patient will start a running progression very slowly.  No need for any changes in medications.  Follow-up again in 6 to 8 weeks    Nonallopathic problems  Decision today to treat with OMT was based on Physical Exam  After verbal consent patient was treated with HVLA, ME, FPR techniques in  thoracic, lumbar, and sacral  areas  Patient tolerated the procedure well with improvement in symptoms  Patient given exercises, stretches and lifestyle modifications  See medications in patient instructions if given  Patient will follow up in 4-8 weeks      The above documentation has been  reviewed and is accurate and complete Lyndal Pulley, DO       Note: This dictation was prepared with Dragon dictation along with smaller phrase technology. Any transcriptional errors that result from this process are unintentional.

## 2020-10-09 NOTE — Patient Instructions (Signed)
Good to see you Do the running progression 1 mile per day per week Continue everything else See me again in 6 weeks

## 2020-11-26 ENCOUNTER — Ambulatory Visit (INDEPENDENT_AMBULATORY_CARE_PROVIDER_SITE_OTHER): Payer: BC Managed Care – PPO | Admitting: Family Medicine

## 2020-11-26 ENCOUNTER — Encounter: Payer: Self-pay | Admitting: Family Medicine

## 2020-11-26 VITALS — BP 130/90 | Ht 62.0 in | Wt 125.0 lb

## 2020-11-26 DIAGNOSIS — M9904 Segmental and somatic dysfunction of sacral region: Secondary | ICD-10-CM

## 2020-11-26 DIAGNOSIS — M25551 Pain in right hip: Secondary | ICD-10-CM | POA: Diagnosis not present

## 2020-11-26 DIAGNOSIS — M9903 Segmental and somatic dysfunction of lumbar region: Secondary | ICD-10-CM | POA: Diagnosis not present

## 2020-11-26 DIAGNOSIS — M9902 Segmental and somatic dysfunction of thoracic region: Secondary | ICD-10-CM

## 2020-11-26 NOTE — Progress Notes (Signed)
[name] [address] [place] [address] Phone: [phone] Subjective:   I Melanie [name] am serving as a Neurosurgeon for Dr. Antoine Primas.  This visit occurred during the SARS-CoV-2 public health emergency.  Safety protocols were in place, including screening questions prior to the visit, additional usage of staff PPE, and extensive cleaning of exam room while observing appropriate contact time as indicated for disinfecting solutions.   I'm seeing this patient by the request  of:  Patient, No Pcp Per (Inactive)  CC: Back pain follow-up  [id number]  Melanie [name] is a 40 y.o. female coming in with complaint of back and neck pain. OMT 10/09/2020. Patient states she is doing ok. Making some progress.  States that each time that she has had the manipulation and seems to last longer.  Patient has been increasing running and doing pretty well.  Medications patient has been prescribed: None  Taking:         Reviewed prior external information including notes and imaging from previsou exam, outside providers and external EMR if available.   As well as notes that were available from care everywhere and other healthcare systems.  Past medical history, social, surgical and family history all reviewed in electronic medical record.  No pertanent information unless stated regarding to the chief complaint.   No past medical history on file.  No Known Allergies   Review of Systems:  No headache, visual changes, nausea, vomiting, diarrhea, constipation, dizziness, abdominal pain, skin rash, fevers, chills, night sweats, weight loss, swollen lymph nodes, body aches, joint swelling, chest pain, shortness of breath, mood changes. POSITIVE muscle aches  Objective  Blood pressure 130/90, height 5\' 2"  (1.575 m), weight 125 lb (56.7 kg).   General: No apparent distress alert and oriented x3 mood and affect normal, dressed appropriately.  HEENT: Pupils  equal, extraocular movements intact  Respiratory: Patient's speak in full sentences and does not appear short of breath  Cardiovascular: No lower extremity edema, non tender, no erythema  Gait normal with good balance and coordination.  MSK:  Non tender with full range of motion and good stability and symmetric strength and tone of shoulders, elbows, wrist, hip, knee and ankles bilaterally.  Back -significant tightness in the left.  The more discomfort over the right sacroiliac joint still has leg length discrepancy still with moderate.  Intact tenderness noted in the paraspinal musculature of the lumbar spine some of the lumbosacral area   Osteopathic findings   T3 extended rotated and side bent right T9 extended rotated and side bent left L2 flexed rotated and side bent right Sacrum right on right       Assessment and Plan:  Right hip pain Patient is responding fairly well.  Continues to have tightness that were noted of the paraspinal musculature in the hip area.  Patient is still running regularly.  Discussed posture and ergonomics.  Follow-up again with me in 6 weeks      Nonallopathic problems  Decision today to treat with OMT was based on Physical Exam  After verbal consent patient was treated with HVLA, ME, FPR techniques in  thoracic, lumbar, and sacral  areas  Patient tolerated the procedure well with improvement in symptoms  Patient given exercises, stretches and lifestyle modifications  See medications in patient instructions if given  Patient will follow up in 4-8 weeks      The above documentation has been reviewed and is accurate and complete , DO  Note: This dictation was prepared with Dragon dictation along with smaller phrase technology. Any transcriptional errors that result from this process are unintentional.

## 2020-11-26 NOTE — Assessment & Plan Note (Signed)
Patient is responding fairly well.  Continues to have tightness that were noted of the paraspinal musculature in the hip area.  Patient is still running regularly.  Discussed posture and ergonomics.  Follow-up again with me in 6 weeks

## 2020-11-26 NOTE — Patient Instructions (Signed)
Good to see you Try to do the stretches as a cool down See me again in 6 weeks

## 2021-01-01 NOTE — Progress Notes (Signed)
  [name] [address] [place] [address] Phone: [phone] Subjective:   I Melanie [name] am serving as a Neurosurgeon for Dr. Antoine Primas.  This visit occurred during the SARS-CoV-2 public health emergency.  Safety protocols were in place, including screening questions prior to the visit, additional usage of staff PPE, and extensive cleaning of exam room while observing appropriate contact time as indicated for disinfecting solutions.   I'm seeing this patient by the request  of:  Patient, No Pcp Per (Inactive)  CC: Right hip pain follow-up  [id number]  Melanie [name] is a 40 y.o. female coming in with complaint of back and neck pain. OMT 11/26/2020. Patient states she is feeling ok.   Medications patient has been prescribed: None  Taking:         History reviewed. No pertinent past medical history.  No Known Allergies   Review of Systems:  No headache, visual changes, nausea, vomiting, diarrhea, constipation, dizziness, abdominal pain, skin rash, fevers, chills, night sweats, weight loss, swollen lymph nodes, body aches, joint swelling, chest pain, shortness of breath, mood changes. POSITIVE muscle aches  Objective  Blood pressure 108/82, pulse (!) 58, height 5\' 2"  (1.575 m), weight 127 lb (57.6 kg), SpO2 100 %.   General: No apparent distress alert and oriented x3 mood and affect normal, dressed appropriately.  HEENT: Pupils equal, extraocular movements intact  Respiratory: Patient's speak in full sentences and does not appear short of breath  Cardiovascular: No lower extremity edema, non tender, no erythema  Patient is some mild tightness around the right sacroiliac joint.  Mild tightness noted in the right scapular area as well.  Patient though does have otherwise fairly good range of motion except for mild tightness with FABER test.  Osteopathic findings  C2 flexed rotated and side bent right C6 flexed rotated and side bent  left T3 extended rotated and side bent right inhaled rib T7 extended rotated and side bent left L2 flexed rotated and side bent right Sacrum right on right       Assessment and Plan:  Right hip pain Continues to be more of a sacroiliac joint and tight hip flexors.  Continue to do her stretches.  Patient has worked on the muscle instability and I think will continue to do well.  Follow-up again in 6 to 8 weeks.   Nonallopathic problems  Decision today to treat with OMT was based on Physical Exam  After verbal consent patient was treated with HVLA, ME, FPR techniques in thoracic, lumbar, and sacral  areas  Patient tolerated the procedure well with improvement in symptoms  Patient given exercises, stretches and lifestyle modifications  See medications in patient instructions if given  Patient will follow up in 4-8 weeks      The above documentation has been reviewed and is accurate and complete , DO        Note: This dictation was prepared with Dragon dictation along with smaller phrase technology. Any transcriptional errors that result from this process are unintentional.

## 2021-01-10 ENCOUNTER — Other Ambulatory Visit: Payer: Self-pay

## 2021-01-10 ENCOUNTER — Encounter: Payer: Self-pay | Admitting: Family Medicine

## 2021-01-10 ENCOUNTER — Ambulatory Visit: Payer: BC Managed Care – PPO | Admitting: Family Medicine

## 2021-01-10 VITALS — BP 108/82 | HR 58 | Ht 62.0 in | Wt 127.0 lb

## 2021-01-10 DIAGNOSIS — M25551 Pain in right hip: Secondary | ICD-10-CM

## 2021-01-10 DIAGNOSIS — M9904 Segmental and somatic dysfunction of sacral region: Secondary | ICD-10-CM | POA: Diagnosis not present

## 2021-01-10 DIAGNOSIS — M9903 Segmental and somatic dysfunction of lumbar region: Secondary | ICD-10-CM

## 2021-01-10 DIAGNOSIS — M9902 Segmental and somatic dysfunction of thoracic region: Secondary | ICD-10-CM | POA: Diagnosis not present

## 2021-01-10 NOTE — Patient Instructions (Signed)
Good to see you Good luck with the pickle Leave your hubby in the dust See me again in 6-8 weeks

## 2021-01-10 NOTE — Assessment & Plan Note (Signed)
Continues to be more of a sacroiliac joint and tight hip flexors.  Continue to do her stretches.  Patient has worked on the muscle instability and I think will continue to do well.  Follow-up again in 6 to 8 weeks.

## 2021-02-20 NOTE — Progress Notes (Signed)
[name] [address] [place] [address] Phone: [phone] Subjective:   Melanie [name], am serving as a scribe for Dr. Antoine Primas.  This visit occurred during the SARS-CoV-2 public health emergency.  Safety protocols were in place, including screening questions prior to the visit, additional usage of staff PPE, and extensive cleaning of exam room while observing appropriate contact time as indicated for disinfecting solutions.   I'm seeing this patient by the request  of:  Patient, No Pcp Per (Inactive)  CC: Right hip and low back pain follow-up  [id number]  Aziza Tirrell is a 40 y.o. female coming in with complaint of back and neck pain. OMT 01/10/2021. Patient states overall doing relatively well.  Has some mild tightness but nothing severe.  Patient states that has been able to do running on a regular basis.  Doing the stretches still.  Not having to take any medicine.  Medications patient has been prescribed: None          Reviewed prior external information including notes and imaging from previsou exam, outside providers and external EMR if available.   As well as notes that were available from care everywhere and other healthcare systems.  Past medical history, social, surgical and family history all reviewed in electronic medical record.  No pertanent information unless stated regarding to the chief complaint.   No past medical history on file.  No Known Allergies   Review of Systems:  No headache, visual changes, nausea, vomiting, diarrhea, constipation, dizziness, abdominal pain, skin rash, fevers, chills, night sweats, weight loss, swollen lymph nodes, body aches, joint swelling, chest pain, shortness of breath, mood changes. POSITIVE muscle aches  Objective  Blood pressure 122/84, height 5\' 2"  (1.575 m), weight 127 lb (57.6 kg).   General: No apparent distress alert and oriented x3 mood and affect normal, dressed  appropriately.  HEENT: Pupils equal, extraocular movements intact  Respiratory: Patient's speak in full sentences and does not appear short of breath  Cardiovascular: No lower extremity edema, non tender, no erythema  Low back exam still shows some tightness noted of the right hip flexor.  Patient also does have some short leg on the left leg at the moment.  Seems to be more secondary to tightness of the left hamstrings on the left. 5-5 strength of the lower extremities.  Mild tenderness at the thoracolumbar juncture  Osteopathic findings   T11 extended rotated and side bent left L1 flexed rotated and side bent right Sacrum right on right       Assessment and Plan:  Leg length discrepancy Patient is doing better at this moment.  Discussed posture and ergonomics.  Patient does not need to make any other significant changes.  Follow-up with me again 6 to 8 weeks   Nonallopathic problems  Decision today to treat with OMT was based on Physical Exam  After verbal consent patient was treated with HVLA, ME, FPR techniques in  thoracic, lumbar, and sacral  areas  Patient tolerated the procedure well with improvement in symptoms  Patient given exercises, stretches and lifestyle modifications  See medications in patient instructions if given  Patient will follow up in 8 weeks     The above documentation has been reviewed and is accurate and complete , DO        Note: This dictation was prepared with Dragon dictation along with smaller phrase technology. Any transcriptional errors that result from this process are unintentional.

## 2021-02-21 ENCOUNTER — Other Ambulatory Visit: Payer: Self-pay

## 2021-02-21 ENCOUNTER — Encounter: Payer: Self-pay | Admitting: Family Medicine

## 2021-02-21 ENCOUNTER — Ambulatory Visit: Payer: BC Managed Care – PPO | Admitting: Family Medicine

## 2021-02-21 VITALS — BP 122/84 | Ht 62.0 in | Wt 127.0 lb

## 2021-02-21 DIAGNOSIS — M9903 Segmental and somatic dysfunction of lumbar region: Secondary | ICD-10-CM

## 2021-02-21 DIAGNOSIS — M9902 Segmental and somatic dysfunction of thoracic region: Secondary | ICD-10-CM

## 2021-02-21 DIAGNOSIS — M2170 Unequal limb length (acquired), unspecified site: Secondary | ICD-10-CM

## 2021-02-21 DIAGNOSIS — M9904 Segmental and somatic dysfunction of sacral region: Secondary | ICD-10-CM

## 2021-02-21 NOTE — Assessment & Plan Note (Signed)
Patient is doing better at this moment.  Discussed posture and ergonomics.  Patient does not need to make any other significant changes.  Follow-up with me again 6 to 8 weeks

## 2021-02-21 NOTE — Patient Instructions (Signed)
Good to see you Thanks for letting me vent Let's see you 1-2 weeks before race

## 2021-04-03 NOTE — Progress Notes (Signed)
[name] [address] [place] [address] Phone: [phone] Subjective:   [name], am serving as a scribe for Dr. Antoine Primas. This visit occurred during the SARS-CoV-2 public health emergency.  Safety protocols were in place, including screening questions prior to the visit, additional usage of staff PPE, and extensive cleaning of exam room while observing appropriate contact time as indicated for disinfecting solutions.   I'm seeing this patient by the request  of:  Patient, No Pcp Per (Inactive)  CC: Neck and back pain follow-up  [id number]  Melanie [name] is a 40 y.o. female coming in with complaint of back and neck pain. OMT 02/21/2021. Patient states status remains the same. No new complaints.  Medications patient has been prescribed: None        Reviewed prior external information including notes and imaging from previsou exam, outside providers and external EMR if available.   As well as notes that were available from care everywhere and other healthcare systems.  Past medical history, social, surgical and family history all reviewed in electronic medical record.  No pertanent information unless stated regarding to the chief complaint.   No past medical history on file.  No Known Allergies   Review of Systems:  No headache, visual changes, nausea, vomiting, diarrhea, constipation, dizziness, abdominal pain, skin rash, fevers, chills, night sweats, weight loss, swollen lymph nodes, body aches, joint swelling, chest pain, shortness of breath, mood changes. POSITIVE muscle aches  Objective  Blood pressure 122/82, pulse (!) 55, height 5\' 2"  (1.575 m), weight 128 lb (58.1 kg), SpO2 99 %.   General: No apparent distress alert and oriented x3 mood and affect normal, dressed appropriately.  HEENT: Pupils equal, extraocular movements intact  Respiratory: Patient's speak in full sentences and does not appear short of breath   Cardiovascular: No lower extremity edema, non tender, no erythema  Patient has more tightness of the lower back.  Patient does have some mild positive FABER test.  Negative straight leg test.  Still tightness though with some mild difficulty of the right hip with anterior inferior ASIS.  Osteopathic findings  C2 flexed rotated and side bent right C6 flexed rotated and side bent left T6 extended rotated and side bent right inhaled rib L2 flexed rotated and side bent right Sacrum right on right Right anterior ilium      Assessment and Plan:  Right hip pain Still secondary to sacroiliac joint as well as patient's tightness of the hamstrings and the hip flexors.  Discussed once again about the which activities to do which wants to avoid.  We will continue to monitor.  Patient has been able to run relatively well though.  Not taking any medicine other than ibuprofen occasionally.  Follow-up again in 6 to 8 weeks   Nonallopathic problems  Decision today to treat with OMT was based on Physical Exam  After verbal consent patient was treated with HVLA, ME, FPR techniques in cervical, rib, thoracic, lumbar, and sacral and pelvis  areas  Patient tolerated the procedure well with improvement in symptoms  Patient given exercises, stretches and lifestyle modifications  See medications in patient instructions if given  Patient will follow up in 4-8 weeks      The above documentation has been reviewed and is accurate and complete , DO        Note: This dictation was prepared with Dragon dictation along with smaller phrase technology. Any transcriptional errors that result from  this process are unintentional.

## 2021-04-08 ENCOUNTER — Other Ambulatory Visit: Payer: Self-pay

## 2021-04-08 ENCOUNTER — Encounter: Payer: Self-pay | Admitting: Family Medicine

## 2021-04-08 ENCOUNTER — Ambulatory Visit: Payer: BC Managed Care – PPO | Admitting: Family Medicine

## 2021-04-08 VITALS — BP 122/82 | HR 55 | Ht 62.0 in | Wt 128.0 lb

## 2021-04-08 DIAGNOSIS — M9904 Segmental and somatic dysfunction of sacral region: Secondary | ICD-10-CM

## 2021-04-08 DIAGNOSIS — M25551 Pain in right hip: Secondary | ICD-10-CM | POA: Diagnosis not present

## 2021-04-08 DIAGNOSIS — M9902 Segmental and somatic dysfunction of thoracic region: Secondary | ICD-10-CM

## 2021-04-08 DIAGNOSIS — M9908 Segmental and somatic dysfunction of rib cage: Secondary | ICD-10-CM

## 2021-04-08 DIAGNOSIS — M9903 Segmental and somatic dysfunction of lumbar region: Secondary | ICD-10-CM | POA: Diagnosis not present

## 2021-04-08 DIAGNOSIS — M9901 Segmental and somatic dysfunction of cervical region: Secondary | ICD-10-CM

## 2021-04-08 DIAGNOSIS — M9905 Segmental and somatic dysfunction of pelvic region: Secondary | ICD-10-CM | POA: Diagnosis not present

## 2021-04-08 NOTE — Assessment & Plan Note (Signed)
Still secondary to sacroiliac joint as well as patient's tightness of the hamstrings and the hip flexors.  Discussed once again about the which activities to do which wants to avoid.  We will continue to monitor.  Patient has been able to run relatively well though.  Not taking any medicine other than ibuprofen occasionally.  Follow-up again in 6 to 8 weeks

## 2021-04-08 NOTE — Patient Instructions (Signed)
Good to see you! Kick butt in the race Remember to stretch and your cell phone See you again in 6-8 weeks

## 2021-05-27 ENCOUNTER — Ambulatory Visit: Payer: BC Managed Care – PPO | Admitting: Family Medicine

## 2021-06-19 NOTE — Progress Notes (Signed)
[name] [address] [place] [address] Phone: [phone] Subjective:   [name], am serving as a scribe for Dr. Antoine Primas. This visit occurred during the SARS-CoV-2 public health emergency.  Safety protocols were in place, including screening questions prior to the visit, additional usage of staff PPE, and extensive cleaning of exam room while observing appropriate contact time as indicated for disinfecting solutions.   I'm seeing this patient by the request  of:  Patient, No Pcp Per (Inactive)  CC: Patient does have back and neck pain.  [id number]  Tmya [name] is a 40 y.o. female coming in with complaint of back and neck pain. OMT on 04/08/2021. Patient states doing about the same. No new complaints.  Overall seems to be stable.  Nothing severe at the moment.  Nothing that stopping her from activity.  Has not been able to be as active secondary to traveling for the holidays.  Medications patient has been prescribed: None  Taking:         Reviewed prior external information including notes and imaging from previsou exam, outside providers and external EMR if available.   As well as notes that were available from care everywhere and other healthcare systems.  Past medical history, social, surgical and family history all reviewed in electronic medical record.  No pertanent information unless stated regarding to the chief complaint.   No past medical history on file.  No Known Allergies   Review of Systems:  No headache, visual changes, nausea, vomiting, diarrhea, constipation, dizziness, abdominal pain, skin rash, fevers, chills, night sweats, weight loss, swollen lymph nodes, body aches, joint swelling, chest pain, shortness of breath, mood changes. POSITIVE muscle aches  Objective  Blood pressure 132/84, pulse 71, height 5\' 2"  (1.575 m), weight 129 lb (58.5 kg), SpO2 99 %.   General: No apparent distress alert and  oriented x3 mood and affect normal, dressed appropriately.  HEENT: Pupils equal, extraocular movements intact  Respiratory: Patient's speak in full sentences and does not appear short of breath  Cardiovascular: No lower extremity edema, non tender, no erythema  Low back exam does have some mild loss lordosis.  Some tenderness to palpation more in the paraspinal musculature greater than left.  Some mild pelvic shear noted on the right side.  Osteopathic findings  C3 flexed rotated and side bent right C6 flexed rotated and side bent left T3 extended rotated and side bent right inhaled rib T9 extended rotated and side bent left L3 flexed rotated and side bent right Sacrum right on right       Assessment and Plan:  Right hip pain Chronic fall with mild exacerbation.  Patient is responding extremely well though to osteopathic manipulation.  Discussed posture and ergonomics.  Follow-up with me again in 2 months.   Nonallopathic problems  Decision today to treat with OMT was based on Physical Exam  After verbal consent patient was treated with HVLA, ME, FPR techniques in  thoracic, lumbar, and sacral  areas  Patient tolerated the procedure well with improvement in symptoms  Patient given exercises, stretches and lifestyle modifications  See medications in patient instructions if given  Patient will follow up in 4-8 weeks      The above documentation has been reviewed and is accurate and complete , DO        Note: This dictation was prepared with Dragon dictation along with smaller phrase technology. Any transcriptional errors that result from this  process are unintentional.

## 2021-06-24 ENCOUNTER — Other Ambulatory Visit: Payer: Self-pay

## 2021-06-24 ENCOUNTER — Ambulatory Visit: Payer: BC Managed Care – PPO | Admitting: Family Medicine

## 2021-06-24 VITALS — BP 132/84 | HR 71 | Ht 62.0 in | Wt 129.0 lb

## 2021-06-24 DIAGNOSIS — M9903 Segmental and somatic dysfunction of lumbar region: Secondary | ICD-10-CM

## 2021-06-24 DIAGNOSIS — M25551 Pain in right hip: Secondary | ICD-10-CM

## 2021-06-24 DIAGNOSIS — M9904 Segmental and somatic dysfunction of sacral region: Secondary | ICD-10-CM

## 2021-06-24 DIAGNOSIS — M9902 Segmental and somatic dysfunction of thoracic region: Secondary | ICD-10-CM

## 2021-06-24 NOTE — Patient Instructions (Signed)
Good to see you! Thanks for being the ideal patient Get back to the routine See you again in 2 months

## 2021-06-24 NOTE — Assessment & Plan Note (Signed)
Chronic fall with mild exacerbation.  Patient is responding extremely well though to osteopathic manipulation.  Discussed posture and ergonomics.  Follow-up with me again in 2 months.

## 2021-08-21 NOTE — Progress Notes (Signed)
?  Melanie [name] D.O. ?[name] ?[address] [place] [address] ?Phone: [phone] ?Subjective:   ?I, Melanie [name], am serving as a Neurosurgeon for Dr. Antoine Primas. ?This visit occurred during the SARS-CoV-2 public health emergency.  Safety protocols were in place, including screening questions prior to the visit, additional usage of staff PPE, and extensive cleaning of exam room while observing appropriate contact time as indicated for disinfecting solutions.  ? ?I'm seeing this patient by the request  of:  Patient, No Pcp Per (Inactive) ? ?CC: back and neck pain  ? ?[id number]  ?Melanie [name] is a 41 y.o. female coming in with complaint of back and neck pain. OMT on 06/24/2021. Patient states same per usual. No new complaints. ? ?Medications patient has been prescribed: None ? ?Taking: ? ? ?  ? ? ? ? ?Reviewed prior external information including notes and imaging from previsou exam, outside providers and external EMR if available.  ? ?As well as notes that were available from care everywhere and other healthcare systems. ? ?Past medical history, social, surgical and family history all reviewed in electronic medical record.  No pertanent information unless stated regarding to the chief complaint.  ? ?No past medical history on file.  ?No Known Allergies ? ? ?Review of Systems: ? No headache, visual changes, nausea, vomiting, diarrhea, constipation, dizziness, abdominal pain, skin rash, fevers, chills, night sweats, weight loss, swollen lymph nodes, body aches, joint swelling, chest pain, shortness of breath, mood changes. POSITIVE muscle aches ? ?Objective  ?Blood pressure 120/76, pulse 71, height 5\' 2"  (1.575 m), weight 127 lb (57.6 kg), SpO2 99 %. ?  ?General: No apparent distress alert and oriented x3 mood and affect normal, dressed appropriately.  ?HEENT: Pupils equal, extraocular movements intact  ?Respiratory: Patient's speak in full sentences and does not appear short of breath   ?Cardiovascular: No lower extremity edema, non tender, no erythema  ?Low back exam still shows some mild tightness noted in the paraspinal musculature the thoracolumbar juncture noted.  Right hip still noted patient's baseline.  5 out of 5 strength.  Tenderness over the right sacroiliac joint ? ?Osteopathic findings ? ?C2 flexed rotated and side bent right ?C6 flexed rotated and side bent left ?T3 extended rotated and side bent right inhaled rib ?T8 extended rotated and side bent left ?L1 flexed rotated and side bent right ?Sacrum right on right ? ? ? ? ?  ?Assessment and Plan: ? ?Right hip pain ?Responding well though to osteopathic manipulation.  Seems to be for the right hip as well as the sacroiliac joint.  Patient has improved overall.  Mild increase in tightness in the thoracolumbar juncture.  Follow-up with me again 2 to 3 months patient ?  ? ?Nonallopathic problems ? ?Decision today to treat with OMT was based on Physical Exam ? ?After verbal consent patient was treated with HVLA, ME, FPR techniques in cervical, rib, thoracic, lumbar, and sacral  areas ? ?Patient tolerated the procedure well with improvement in symptoms ? ?Patient given exercises, stretches and lifestyle modifications ? ?See medications in patient instructions if given ? ?Patient will follow up in 8 weeks ? ?  ? ? ?The above documentation has been reviewed and is accurate and complete , DO ? ? ? ?  ? ? Note: This dictation was prepared with Dragon dictation along with smaller phrase technology. Any transcriptional errors that result from this process are unintentional.    ?  ?  ? ?

## 2021-08-26 ENCOUNTER — Ambulatory Visit: Payer: BC Managed Care – PPO | Admitting: Family Medicine

## 2021-08-26 ENCOUNTER — Other Ambulatory Visit: Payer: Self-pay

## 2021-08-26 VITALS — BP 120/76 | HR 71 | Ht 62.0 in | Wt 127.0 lb

## 2021-08-26 DIAGNOSIS — M9902 Segmental and somatic dysfunction of thoracic region: Secondary | ICD-10-CM | POA: Diagnosis not present

## 2021-08-26 DIAGNOSIS — M9901 Segmental and somatic dysfunction of cervical region: Secondary | ICD-10-CM | POA: Diagnosis not present

## 2021-08-26 DIAGNOSIS — M9904 Segmental and somatic dysfunction of sacral region: Secondary | ICD-10-CM

## 2021-08-26 DIAGNOSIS — M9908 Segmental and somatic dysfunction of rib cage: Secondary | ICD-10-CM

## 2021-08-26 DIAGNOSIS — M25551 Pain in right hip: Secondary | ICD-10-CM | POA: Diagnosis not present

## 2021-08-26 DIAGNOSIS — M9903 Segmental and somatic dysfunction of lumbar region: Secondary | ICD-10-CM

## 2021-08-26 NOTE — Patient Instructions (Signed)
Good to see you! ?Keep being active ?See you again in 6-8 weeks ?

## 2021-08-27 NOTE — Assessment & Plan Note (Signed)
Responding well though to osteopathic manipulation.  Seems to be for the right hip as well as the sacroiliac joint.  Patient has improved overall.  Mild increase in tightness in the thoracolumbar juncture.  Follow-up with me again 2 to 3 months patient ?

## 2021-10-14 NOTE — Progress Notes (Signed)
?  Terrilee [name] D.O. ?[facility] ?[address] [place] [address] ?Phone: [phone] ?Subjective:   ?I, Melanie [name], am serving as a scribe for Dr. Antoine [name]. ? ?This visit occurred during the SARS-CoV-2 public health emergency.  Safety protocols were in place, including screening questions prior to the visit, additional usage of staff PPE, and extensive cleaning of exam room while observing appropriate contact time as indicated for disinfecting solutions.  ? ? ?I'm seeing this patient by the request  of:  Patient, No Pcp Per (Inactive) ? ?CC: neck pain and back pain  ? ?[id number]  ?Melanie [name] is a 41 y.o. female coming in with complaint of back and neck pain. OMT on 08/26/2021. Patient states that she is doing ok. Same as last visit. Feels tight.  ? ?Medications patient has been prescribed: None ? ? ? ?  ? ? ? ? ? ?Objective  ?Blood pressure 110/90, pulse 64, height 5\' 2"  (1.575 m), weight 125 lb (56.7 kg), SpO2 99 %. ?  ?General: No apparent distress alert and oriented x3 mood and affect normal, dressed appropriately.  ?HEENT: Pupils equal, extraocular movements intact  ?Respiratory: Patient's speak in full sentences and does not appear short of breath  ?Cardiovascular: No lower extremity edema, non tender, no erythema  ? ? ?Osteopathic findings ? ?C2 flexed rotated and side bent right ?C5 flexed rotated and side bent left ?T3 extended rotated and side bent right inhaled rib ?L2 flexed rotated and side bent left ?L5 flexed rotated and side bent ?Sacrum right on right ? ? ? ? ?  ?Assessment and Plan: ? ?Right hip pain ?Patient is unremarkably well at this time.  Discussed posture and ergonomics.  He can actually have patient email every 2 to 3 months.  No significant other changes. ? ? ?Nonallopathic problems ? ?Decision today to treat with OMT was based on Physical Exam ? ?After verbal consent patient was treated with HVLA, ME, FPR techniques in cervical, rib, thoracic, lumbar, and  sacral  areas ? ?Patient tolerated the procedure well with improvement in symptoms ? ?Patient given exercises, stretches and lifestyle modifications ? ?See medications in patient instructions if given ? ?Patient will follow up in 4-8 weeks ? ?  ? ? ?The above documentation has been reviewed and is accurate and complete , DO ? ? ?  ? ? Note: This dictation was prepared with Dragon dictation along with smaller phrase technology. Any transcriptional errors that result from this process are unintentional.    ?  ?  ? ?

## 2021-10-15 ENCOUNTER — Ambulatory Visit (INDEPENDENT_AMBULATORY_CARE_PROVIDER_SITE_OTHER): Payer: BC Managed Care – PPO | Admitting: Family Medicine

## 2021-10-15 VITALS — BP 110/90 | HR 64 | Ht 62.0 in | Wt 125.0 lb

## 2021-10-15 DIAGNOSIS — M9908 Segmental and somatic dysfunction of rib cage: Secondary | ICD-10-CM

## 2021-10-15 DIAGNOSIS — M9903 Segmental and somatic dysfunction of lumbar region: Secondary | ICD-10-CM | POA: Diagnosis not present

## 2021-10-15 DIAGNOSIS — M9902 Segmental and somatic dysfunction of thoracic region: Secondary | ICD-10-CM

## 2021-10-15 DIAGNOSIS — M25551 Pain in right hip: Secondary | ICD-10-CM | POA: Diagnosis not present

## 2021-10-15 DIAGNOSIS — M9904 Segmental and somatic dysfunction of sacral region: Secondary | ICD-10-CM | POA: Diagnosis not present

## 2021-10-15 DIAGNOSIS — M9901 Segmental and somatic dysfunction of cervical region: Secondary | ICD-10-CM

## 2021-10-15 NOTE — Assessment & Plan Note (Signed)
Patient is unremarkably well at this time.  Discussed posture and ergonomics.  He can actually have patient email every 2 to 3 months.  No significant other changes. ?

## 2021-10-15 NOTE — Patient Instructions (Signed)
Enjoy beach ?Sorry about my geography ?See me in 2-3 months ?

## 2021-12-25 NOTE — Progress Notes (Deleted)
  [name] [address] [place] [address] Phone: [phone] Subjective:    I'm seeing this patient by the request  of:  Patient, No Pcp Per  CC:   [id number]  Meghna Linders is a 41 y.o. female coming in with complaint of back and neck pain. OMT on 10/15/2021. Patient states   Medications patient has been prescribed: None  Taking:         Reviewed prior external information including notes and imaging from previsou exam, outside providers and external EMR if available.   As well as notes that were available from care everywhere and other healthcare systems.  Past medical history, social, surgical and family history all reviewed in electronic medical record.  No pertanent information unless stated regarding to the chief complaint.   No past medical history on file.  No Known Allergies   Review of Systems:  No headache, visual changes, nausea, vomiting, diarrhea, constipation, dizziness, abdominal pain, skin rash, fevers, chills, night sweats, weight loss, swollen lymph nodes, body aches, joint swelling, chest pain, shortness of breath, mood changes. POSITIVE muscle aches  Objective  There were no vitals taken for this visit.   General: No apparent distress alert and oriented x3 mood and affect normal, dressed appropriately.  HEENT: Pupils equal, extraocular movements intact  Respiratory: Patient's speak in full sentences and does not appear short of breath  Cardiovascular: No lower extremity edema, non tender, no erythema  Gait MSK:  Back   Osteopathic findings  C2 flexed rotated and side bent right C6 flexed rotated and side bent left T3 extended rotated and side bent right inhaled rib T9 extended rotated and side bent left L2 flexed rotated and side bent right Sacrum right on right       Assessment and Plan:  No problem-specific Assessment & Plan notes found for this encounter.    Nonallopathic  problems  Decision today to treat with OMT was based on Physical Exam  After verbal consent patient was treated with HVLA, ME, FPR techniques in cervical, rib, thoracic, lumbar, and sacral  areas  Patient tolerated the procedure well with improvement in symptoms  Patient given exercises, stretches and lifestyle modifications  See medications in patient instructions if given  Patient will follow up in 4-8 weeks             Note: This dictation was prepared with Dragon dictation along with smaller phrase technology. Any transcriptional errors that result from this process are unintentional.

## 2021-12-31 ENCOUNTER — Ambulatory Visit: Payer: BC Managed Care – PPO | Admitting: Family Medicine

## 2022-01-20 NOTE — Progress Notes (Unsigned)
  [name] [address] [place] [address] Phone: [phone] Subjective:   Melanie [name], am serving as a scribe for Dr. Antoine Primas.  I'm seeing this patient by the request  of:  Patient, No Pcp Per  CC: Back and neck pain follow-up  [id number]  Melanie [name] is a 41 y.o. female coming in with complaint of back and neck pain. OMT 10/15/2021. Patient states doing well. Here for manipulation. No new issues.  Medications patient has been prescribed: None  Taking:         Reviewed prior external information including notes and imaging from previsou exam, outside providers and external EMR if available.   As well as notes that were available from care everywhere and other healthcare systems.  Past medical history, social, surgical and family history all reviewed in electronic medical record.  No pertanent information unless stated regarding to the chief complaint.   No past medical history on file.  No Known Allergies   Review of Systems:  No headache, visual changes, nausea, vomiting, diarrhea, constipation, dizziness, abdominal pain, skin rash, fevers, chills, night sweats, weight loss, swollen lymph nodes, body aches, joint swelling, chest pain, shortness of breath, mood changes. POSITIVE muscle aches  Objective  Blood pressure (!) 128/90, pulse 63, height 5\' 2"  (1.575 m), weight 124 lb (56.2 kg), SpO2 99 %.   General: No apparent distress alert and oriented x3 mood and affect normal, dressed appropriately.  HEENT: Pupils equal, extraocular movements intact  Respiratory: Patient's speak in full sentences and does not appear short of breath  Cardiovascular: No lower extremity edema, non tender, no erythema  Gait MSK:  Back patient's low back does have tightness noted in the right sacroiliac joint.  Negative straight leg test.  Patient does have some mildly limited range of motion.  Tightness noted in the thoracolumbar juncture  as well.  Seems to be right greater than left.  Osteopathic findings  C2 flexed rotated and side bent right C5 flexed rotated and side bent left T3 extended rotated and side bent right inhaled rib T9 extended rotated and side bent left L1 flexed rotated and side bent right Sacrum right on right       Assessment and Plan:  Right hip pain Continues to have tightness of the right hip and the lower back.  Does respond extremely well though to osteopathic manipulation.  Has been driving more frequently but I do think with travel could have potentially caused a mild exacerbation.  Not using any medications at the moment.  Follow-up with me again in 6 to 8 weeks otherwise.    Nonallopathic problems  Decision today to treat with OMT was based on Physical Exam  After verbal consent patient was treated with HVLA, ME, FPR techniques in cervical, rib, thoracic, lumbar, and sacral  areas  Patient tolerated the procedure well with improvement in symptoms  Patient given exercises, stretches and lifestyle modifications  See medications in patient instructions if given  Patient will follow up in 4-8 weeks    The above documentation has been reviewed and is accurate and complete , DO          Note: This dictation was prepared with Dragon dictation along with smaller phrase technology. Any transcriptional errors that result from this process are unintentional.

## 2022-01-21 ENCOUNTER — Ambulatory Visit: Payer: BC Managed Care – PPO | Admitting: Family Medicine

## 2022-01-21 VITALS — BP 128/90 | HR 63 | Ht 62.0 in | Wt 124.0 lb

## 2022-01-21 DIAGNOSIS — M9904 Segmental and somatic dysfunction of sacral region: Secondary | ICD-10-CM | POA: Diagnosis not present

## 2022-01-21 DIAGNOSIS — M9903 Segmental and somatic dysfunction of lumbar region: Secondary | ICD-10-CM | POA: Diagnosis not present

## 2022-01-21 DIAGNOSIS — M9908 Segmental and somatic dysfunction of rib cage: Secondary | ICD-10-CM

## 2022-01-21 DIAGNOSIS — M25551 Pain in right hip: Secondary | ICD-10-CM | POA: Diagnosis not present

## 2022-01-21 DIAGNOSIS — M9901 Segmental and somatic dysfunction of cervical region: Secondary | ICD-10-CM

## 2022-01-21 DIAGNOSIS — M9902 Segmental and somatic dysfunction of thoracic region: Secondary | ICD-10-CM

## 2022-01-21 NOTE — Patient Instructions (Signed)
Good to see you! Get back into a routine

## 2022-01-21 NOTE — Assessment & Plan Note (Signed)
Continues to have tightness of the right hip and the lower back.  Does respond extremely well though to osteopathic manipulation.  Has been driving more frequently but I do think with travel could have potentially caused a mild exacerbation.  Not using any medications at the moment.  Follow-up with me again in 6 to 8 weeks otherwise.

## 2022-03-17 NOTE — Progress Notes (Unsigned)
  [facility] Phone: [phone] Subjective:   Melanie Melanie [name], am serving as a scribe for Dr. Hulan Melanie [name].  I'm seeing this patient by the request  of:  Patient, Melanie [name] Pcp Per  CC: neck and back pain follow up   [id number]  Melanie Melanie [name] is a 41 y.o. female coming in with complaint of back and neck pain. OMT on 01/21/2022. Patient states that she is doing alright. Neck is tight today.   Medications patient has been prescribed: None  Taking:         Reviewed prior external information including notes and imaging from previsou exam, outside providers and external EMR if available.   As well as notes that were available from care everywhere and other healthcare systems.  Past medical history, social, surgical and family history all reviewed in electronic medical record.  Melanie [name] pertanent information unless stated regarding to the chief complaint.   Melanie [name] past medical history on file.  Melanie [name] Known Allergies   Review of Systems:  Melanie [name] headache, visual changes, nausea, vomiting, diarrhea, constipation, dizziness, abdominal pain, skin rash, fevers, chills, night sweats, weight loss, swollen lymph nodes, body aches, joint swelling, chest pain, shortness of breath, mood changes. POSITIVE muscle aches  Objective  Blood pressure (!) 114/90, pulse (!) 57, height 5\' 2"  (1.575 m), weight 126 lb (57.2 kg), SpO2 99 %.   General: Melanie [name] apparent distress alert and oriented x3 mood and affect normal, dressed appropriately.  HEENT: Pupils equal, extraocular movements intact  Respiratory: Patient's speak in full sentences and does not appear short of breath  Cardiovascular: Melanie [name] lower extremity edema, non tender, Melanie [name] erythema  Gait MSK:  Back does have some loss of lordosis.  Some tightness with FABER test right greater than left.  Neck exam does have some limited sidebending bilaterally.  Osteopathic findings  C2 flexed rotated and  side bent right C7 flexed rotated and side bent left T3 extended rotated and side bent right inhaled rib T8 extended rotated and side bent left L1 flexed rotated and side bent right Sacrum right on right       Assessment and Plan:  Right hip pain Continues to be more in the sacroiliac joints and Gluteus medius.  Responding extremely well though to osteopathic manipulation.  Melanie [name] significant changes management and follow-up again in 6 to 12 weeks    Nonallopathic problems  Decision today to treat with OMT was based on Physical Exam  After verbal consent patient was treated with HVLA, ME, FPR techniques in cervical, rib, thoracic, lumbar, and sacral  areas  Patient tolerated the procedure well with improvement in symptoms  Patient given exercises, stretches and lifestyle modifications  See medications in patient instructions if given  Patient will follow up in 4-8 weeks    The above documentation has been reviewed and is accurate and complete Melanie Pulley, DO          Note: This dictation was prepared with Dragon dictation along with smaller phrase technology. Any transcriptional errors that result from this process are unintentional.

## 2022-03-18 ENCOUNTER — Ambulatory Visit: Payer: BC Managed Care – PPO | Admitting: Family Medicine

## 2022-03-18 VITALS — BP 114/90 | HR 57 | Ht 62.0 in | Wt 126.0 lb

## 2022-03-18 DIAGNOSIS — M9903 Segmental and somatic dysfunction of lumbar region: Secondary | ICD-10-CM

## 2022-03-18 DIAGNOSIS — M25551 Pain in right hip: Secondary | ICD-10-CM | POA: Diagnosis not present

## 2022-03-18 DIAGNOSIS — M9908 Segmental and somatic dysfunction of rib cage: Secondary | ICD-10-CM | POA: Diagnosis not present

## 2022-03-18 DIAGNOSIS — M9904 Segmental and somatic dysfunction of sacral region: Secondary | ICD-10-CM

## 2022-03-18 DIAGNOSIS — M9901 Segmental and somatic dysfunction of cervical region: Secondary | ICD-10-CM | POA: Diagnosis not present

## 2022-03-18 DIAGNOSIS — M9902 Segmental and somatic dysfunction of thoracic region: Secondary | ICD-10-CM

## 2022-03-18 NOTE — Patient Instructions (Addendum)
Good to see you Congrats on new house Remember shoulder down and back  See me in 2 months

## 2022-03-19 NOTE — Assessment & Plan Note (Signed)
Continues to be more in the sacroiliac joints and Gluteus medius.  Responding extremely well though to osteopathic manipulation.  No significant changes management and follow-up again in 6 to 12 weeks

## 2022-05-13 NOTE — Progress Notes (Unsigned)
  [name] [address] [place] [address] Phone: [phone] Subjective:   Bruce Donath, am serving as a scribe for Dr. Antoine Primas.  I'm seeing this patient by the request  of:  Patient, No Pcp Per  CC: Neck and back pain follow-up  [id number]  Melanie [name] is a 41 y.o. female coming in with complaint of back and neck pain. OMT 03/18/2022. Patient states that she has been doing well since last visit. Here for maintenance appointment.  Has noted some tightness since some discomfort.  Patient states that nothing that has stopped her from activity.  Medications patient has been prescribed: None  Taking:         Reviewed prior external information including notes and imaging from previsou exam, outside providers and external EMR if available.   As well as notes that were available from care everywhere and other healthcare systems.  Past medical history, social, surgical and family history all reviewed in electronic medical record.  No pertanent information unless stated regarding to the chief complaint.   No past medical history on file.  No Known Allergies   Review of Systems:  No headache, visual changes, nausea, vomiting, diarrhea, constipation, dizziness, abdominal pain, skin rash, fevers, chills, night sweats, weight loss, swollen lymph nodes, body aches, joint swelling, chest pain, shortness of breath, mood changes. POSITIVE muscle aches  Objective  Blood pressure 130/88, pulse (!) 58, height 5\' 2"  (1.575 m), SpO2 99 %.   General: No apparent distress alert and oriented x3 mood and affect normal, dressed appropriately.  HEENT: Pupils equal, extraocular movements intact  Respiratory: Patient's speak in full sentences and does not appear short of breath  Cardiovascular: No lower extremity edema, non tender, no erythema  Neck exam does have some loss of lordosis.  Some tenderness to palpation in the paraspinal musculature.   Low back does have some tightness and some tenderness more over the right sacroiliac joint.  Still some tightness in the piriformis noted on the right compared to left.  Osteopathic findings  C6 flexed rotated and side bent right T3 extended rotated and side bent right inhaled rib L1 flexed rotated and side bent right Sacrum right on right       Assessment and Plan:  Right hip pain Still seems to be more of the sacroiliac joint.  Had significant tightness noted today but did respond well to osteopathic manipulation.  Recently has been traveling.  Discussed with patient icing regimen and home exercises.  Follow-up with me in 6 to 8 weeks    Nonallopathic problems  Decision today to treat with OMT was based on Physical Exam  After verbal consent patient was treated with HVLA, ME, FPR techniques in cervical, rib, thoracic, lumbar, and sacral  areas  Patient tolerated the procedure well with improvement in symptoms  Patient given exercises, stretches and lifestyle modifications  See medications in patient instructions if given  Patient will follow up in 4-8 weeks    The above documentation has been reviewed and is accurate and complete , DO          Note: This dictation was prepared with Dragon dictation along with smaller phrase technology. Any transcriptional errors that result from this process are unintentional.

## 2022-05-19 ENCOUNTER — Ambulatory Visit: Payer: BC Managed Care – PPO | Admitting: Family Medicine

## 2022-05-19 VITALS — BP 130/88 | HR 58 | Ht 62.0 in

## 2022-05-19 DIAGNOSIS — M9901 Segmental and somatic dysfunction of cervical region: Secondary | ICD-10-CM | POA: Diagnosis not present

## 2022-05-19 DIAGNOSIS — M25551 Pain in right hip: Secondary | ICD-10-CM | POA: Diagnosis not present

## 2022-05-19 DIAGNOSIS — M9908 Segmental and somatic dysfunction of rib cage: Secondary | ICD-10-CM | POA: Diagnosis not present

## 2022-05-19 DIAGNOSIS — M9902 Segmental and somatic dysfunction of thoracic region: Secondary | ICD-10-CM

## 2022-05-19 DIAGNOSIS — M9904 Segmental and somatic dysfunction of sacral region: Secondary | ICD-10-CM | POA: Diagnosis not present

## 2022-05-19 DIAGNOSIS — M9903 Segmental and somatic dysfunction of lumbar region: Secondary | ICD-10-CM

## 2022-05-19 NOTE — Patient Instructions (Signed)
Good luck with the house See me in 2 months

## 2022-05-19 NOTE — Assessment & Plan Note (Signed)
Still seems to be more of the sacroiliac joint.  Had significant tightness noted today but did respond well to osteopathic manipulation.  Recently has been traveling.  Discussed with patient icing regimen and home exercises.  Follow-up with me in 6 to 8 weeks

## 2022-07-21 ENCOUNTER — Ambulatory Visit: Payer: BC Managed Care – PPO | Admitting: Family Medicine

## 2022-08-13 NOTE — Progress Notes (Signed)
[name] [place] [address] [place] Phone: [phone] Subjective:   [name], am serving as a scribe for Dr. Hulan Saas.  I'm seeing this patient by the request  of:  Patient, No Pcp Per  CC: Neck and back pain follow-up  [id number]  Melanie [name] is a 42 y.o. female coming in with complaint of back and neck pain. OMT 05/19/2022. Patient states a little worse than last visit. Pushed this visit out and recently drove for several hours. No new concerns  Medications patient has been prescribed: None  Taking:         Reviewed prior external information including notes and imaging from previsou exam, outside providers and external EMR if available.   As well as notes that were available from care everywhere and other healthcare systems.  Past medical history, social, surgical and family history all reviewed in electronic medical record.  No pertanent information unless stated regarding to the chief complaint.   No past medical history on file.  No Known Allergies   Review of Systems:  No headache, visual changes, nausea, vomiting, diarrhea, constipation, dizziness, abdominal pain, skin rash, fevers, chills, night sweats, weight loss, swollen lymph nodes, body aches, joint swelling, chest pain, shortness of breath, mood changes. POSITIVE muscle aches  Objective  Blood pressure (!) 130/90, pulse 78, height '5\' 2"'$  (1.575 m), weight 129 lb (58.5 kg), SpO2 99 %.   General: No apparent distress alert and oriented x3 mood and affect normal, dressed appropriately.  HEENT: Pupils equal, extraocular movements intact  Respiratory: Patient's speak in full sentences and does not appear short of breath  Cardiovascular: No lower extremity edema, non tender, no erythema  Neck exam does have some loss of lordosis.  Some tenderness to palpation noted as well.  Low back does have significant tightness with Corky Sox on the right side.  Some  tightness in the paraspinal musculature of the lumbar spine right greater than left.  Osteopathic findings  C3 flexed rotated and side bent right C6 flexed rotated and side bent left T3 extended rotated and side bent right inhaled rib T9 extended rotated and side bent left L2 flexed rotated and side bent right L3 flexed rotated and side bent left Sacrum right on right       Assessment and Plan:  Right hip pain Chronic, with exacerbation noted today.  Did have an exacerbation noted that was concerning more secondary to the environment she was in.  Patient was in a different house working at a different desk that likely was contributing to some of the discomfort as well.  Discussed icing regimen and home exercises, discussed which activities to do and which ones to avoid.  Follow-up again in 6 to 8 weeks    Nonallopathic problems  Decision today to treat with OMT was based on Physical Exam  After verbal consent patient was treated with HVLA, ME, FPR techniques in cervical, rib, thoracic, lumbar, and sacral  areas  Patient tolerated the procedure well with improvement in symptoms  Patient given exercises, stretches and lifestyle modifications  See medications in patient instructions if given  Patient will follow up in 4-8 weeks    The above documentation has been reviewed and is accurate and complete Lyndal Pulley, DO          Note: This dictation was prepared with Dragon dictation along with smaller phrase technology. Any transcriptional errors that result from this process are unintentional.

## 2022-08-14 ENCOUNTER — Ambulatory Visit: Payer: BC Managed Care – PPO | Admitting: Family Medicine

## 2022-08-14 ENCOUNTER — Encounter: Payer: Self-pay | Admitting: Family Medicine

## 2022-08-14 VITALS — BP 130/90 | HR 78 | Ht 62.0 in | Wt 129.0 lb

## 2022-08-14 DIAGNOSIS — M9908 Segmental and somatic dysfunction of rib cage: Secondary | ICD-10-CM

## 2022-08-14 DIAGNOSIS — M9904 Segmental and somatic dysfunction of sacral region: Secondary | ICD-10-CM | POA: Diagnosis not present

## 2022-08-14 DIAGNOSIS — M9901 Segmental and somatic dysfunction of cervical region: Secondary | ICD-10-CM | POA: Diagnosis not present

## 2022-08-14 DIAGNOSIS — M25551 Pain in right hip: Secondary | ICD-10-CM | POA: Diagnosis not present

## 2022-08-14 DIAGNOSIS — M9902 Segmental and somatic dysfunction of thoracic region: Secondary | ICD-10-CM

## 2022-08-14 DIAGNOSIS — M9903 Segmental and somatic dysfunction of lumbar region: Secondary | ICD-10-CM | POA: Diagnosis not present

## 2022-08-14 IMAGING — DX DG LUMBAR SPINE 2-3V
3 series · 3 of 3 positions shown · non-contrast
Comparison: None.

CLINICAL DATA: Lumbar pain.

EXAM:
LUMBAR SPINE - 2-3 VIEW

[l-spine ap]
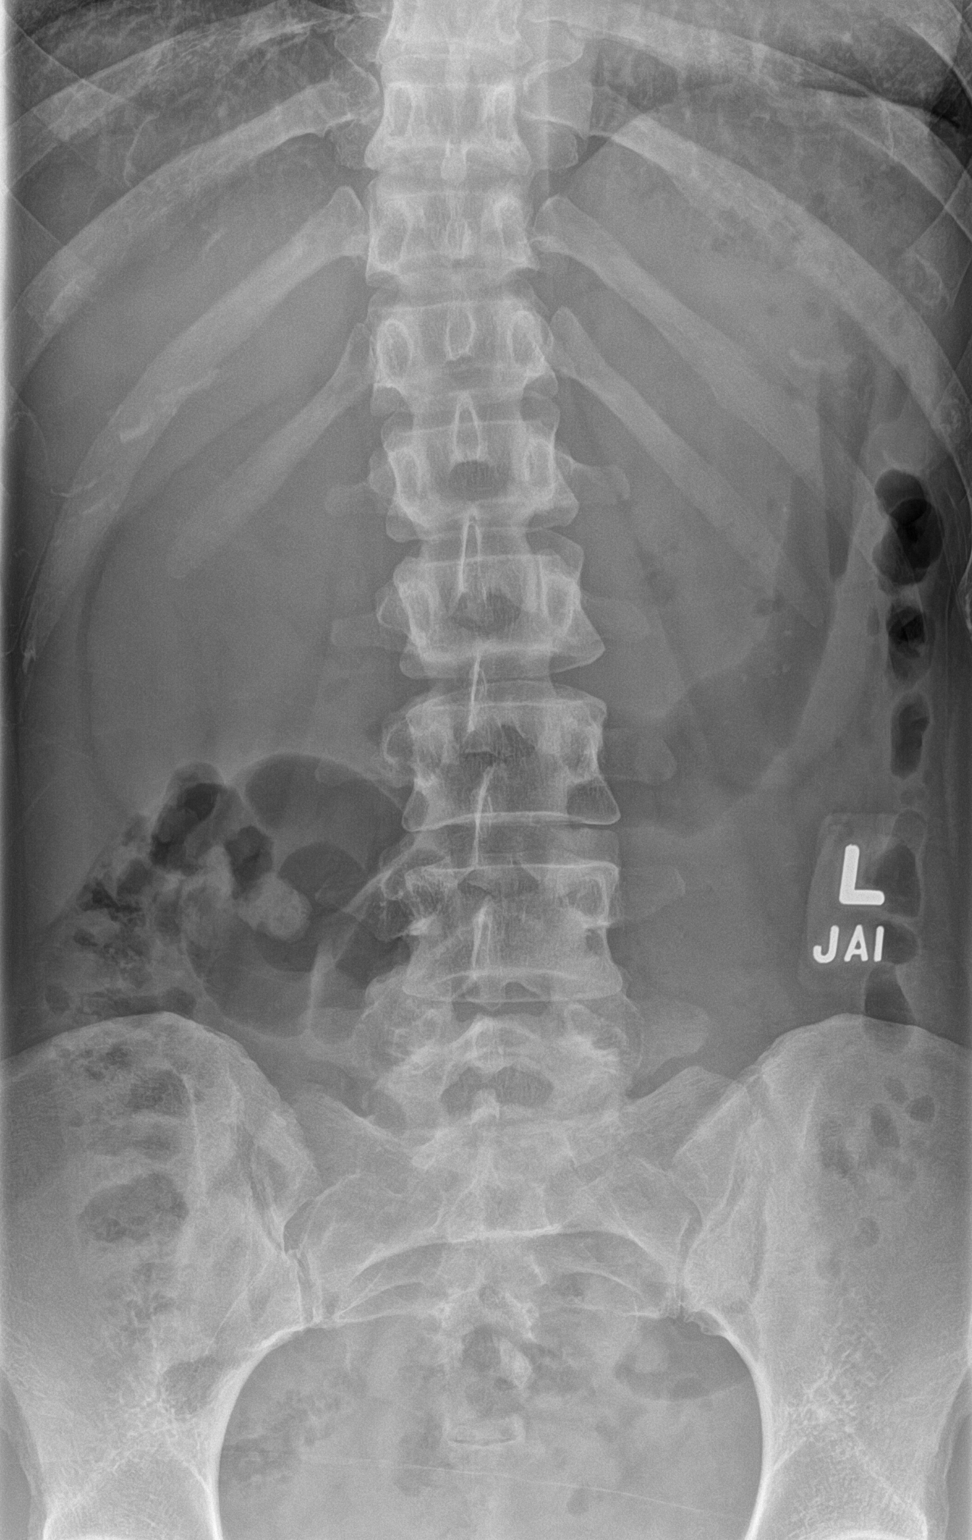

[l-spine lateral (1 of 2)]
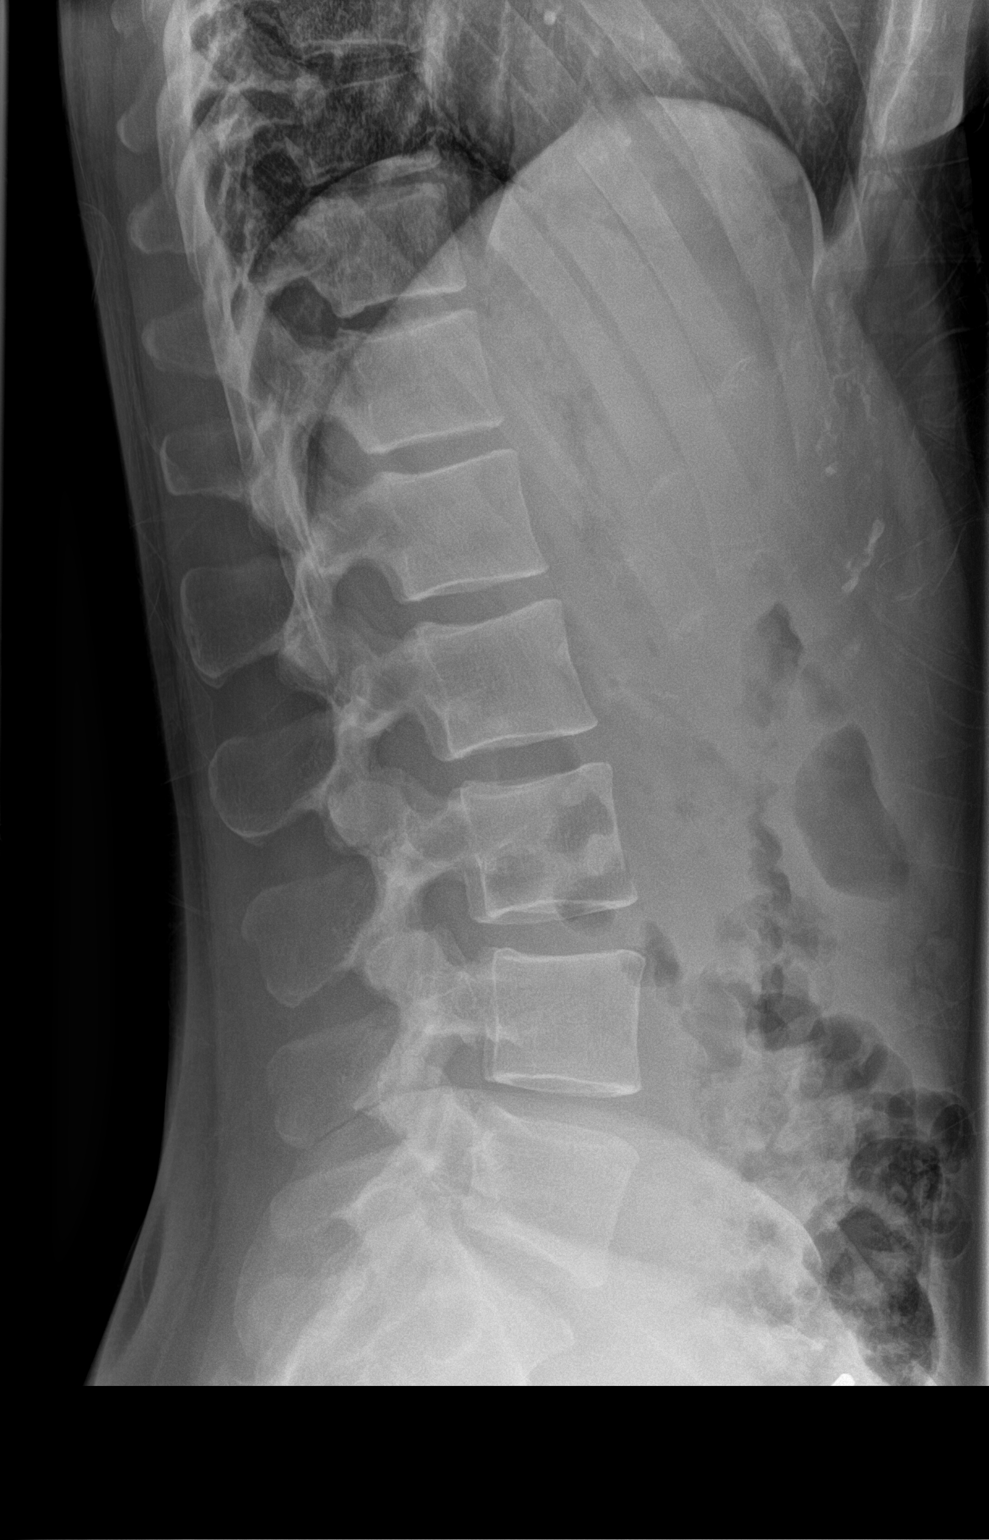

[l-spine lateral (2 of 2)]
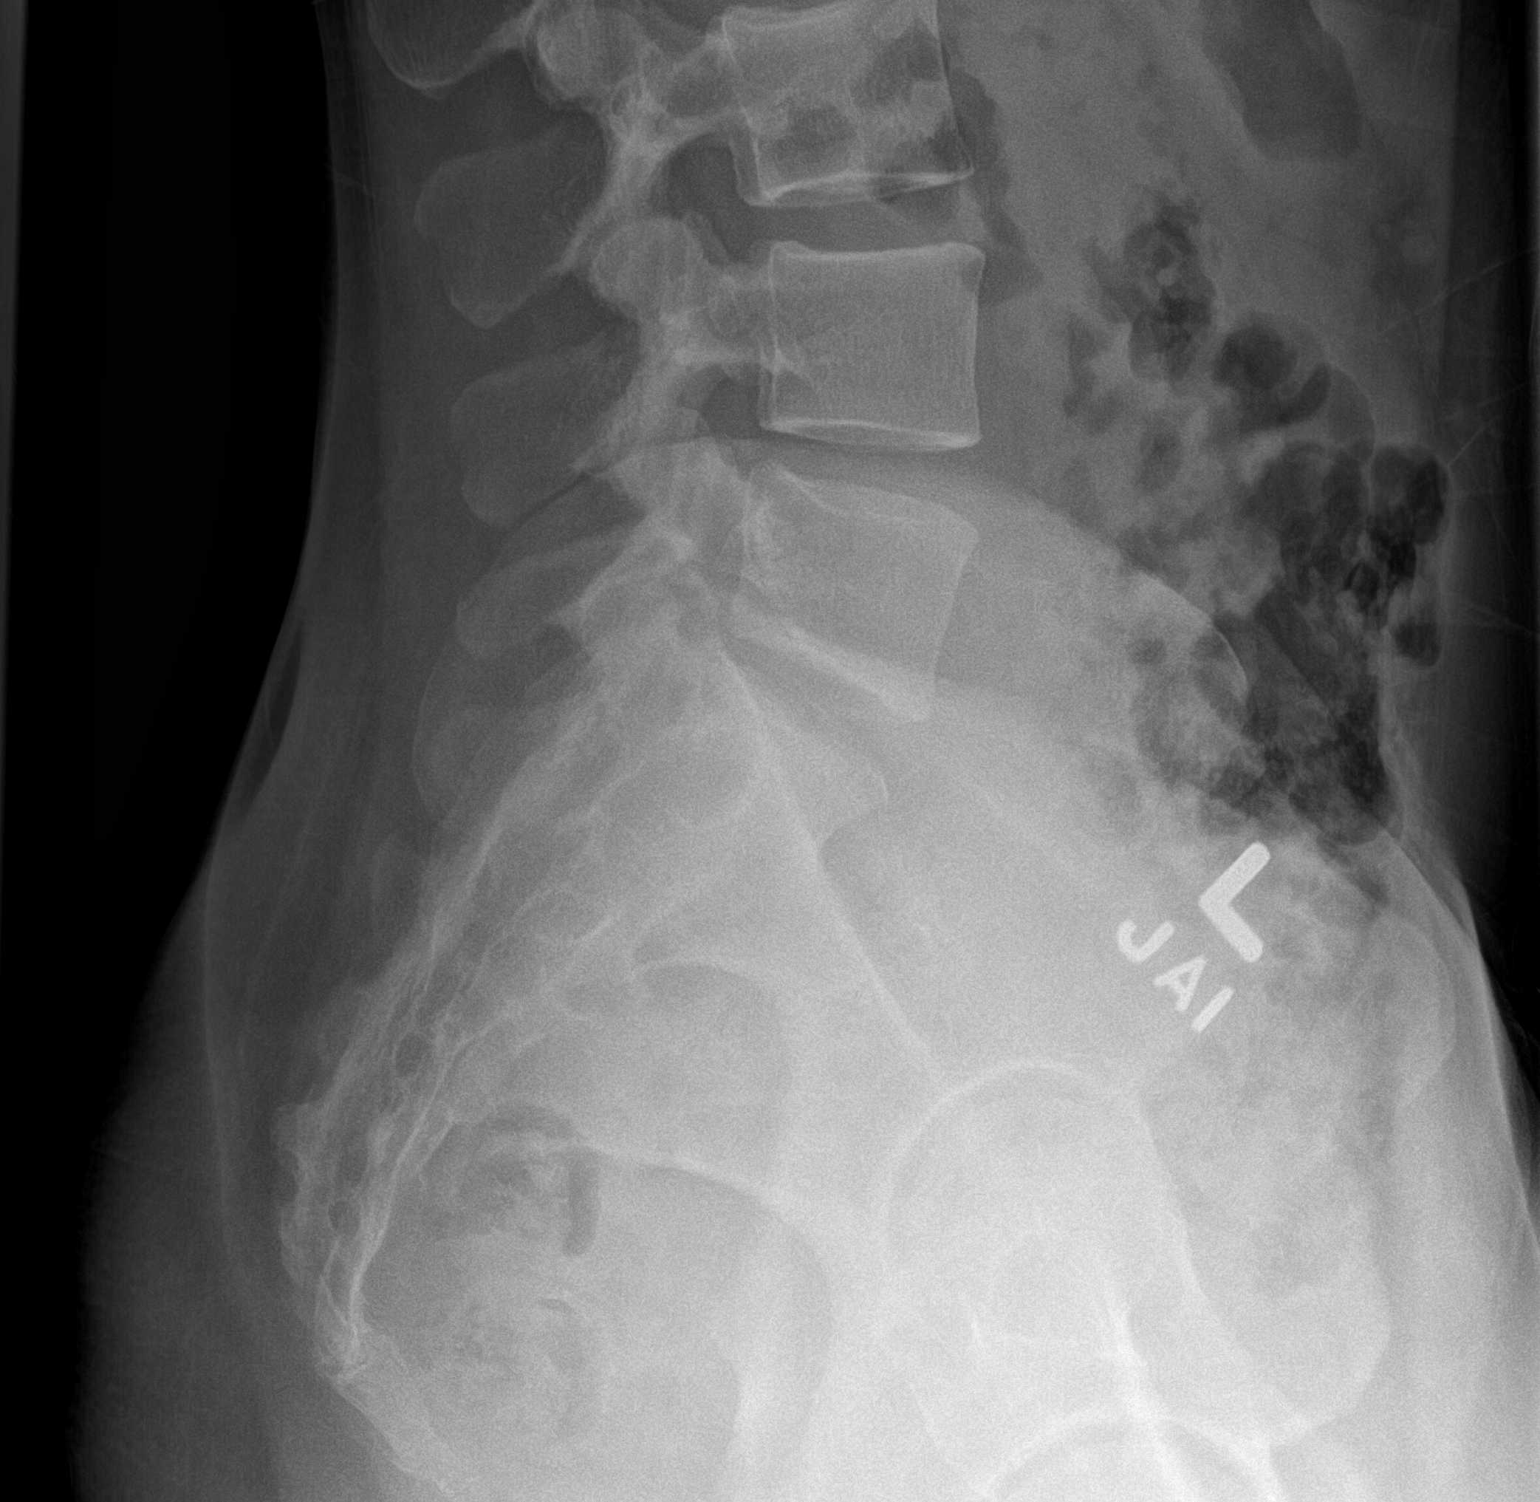

[3 of 3 positions shown; findings below may reference images not displayed]

FINDINGS: There is no evidence of lumbar spine fracture. Alignment is normal.
Intervertebral disc spaces are maintained.
IMPRESSION: Negative.

## 2022-08-14 IMAGING — DX DG PELVIS 1-2V
1 series · 1 of 1 positions shown · non-contrast
Comparison: 09/28/2017

CLINICAL DATA: Pain.  Right hip pain.

EXAM:
PELVIS - 1-2 VIEW

[pelvis ap]
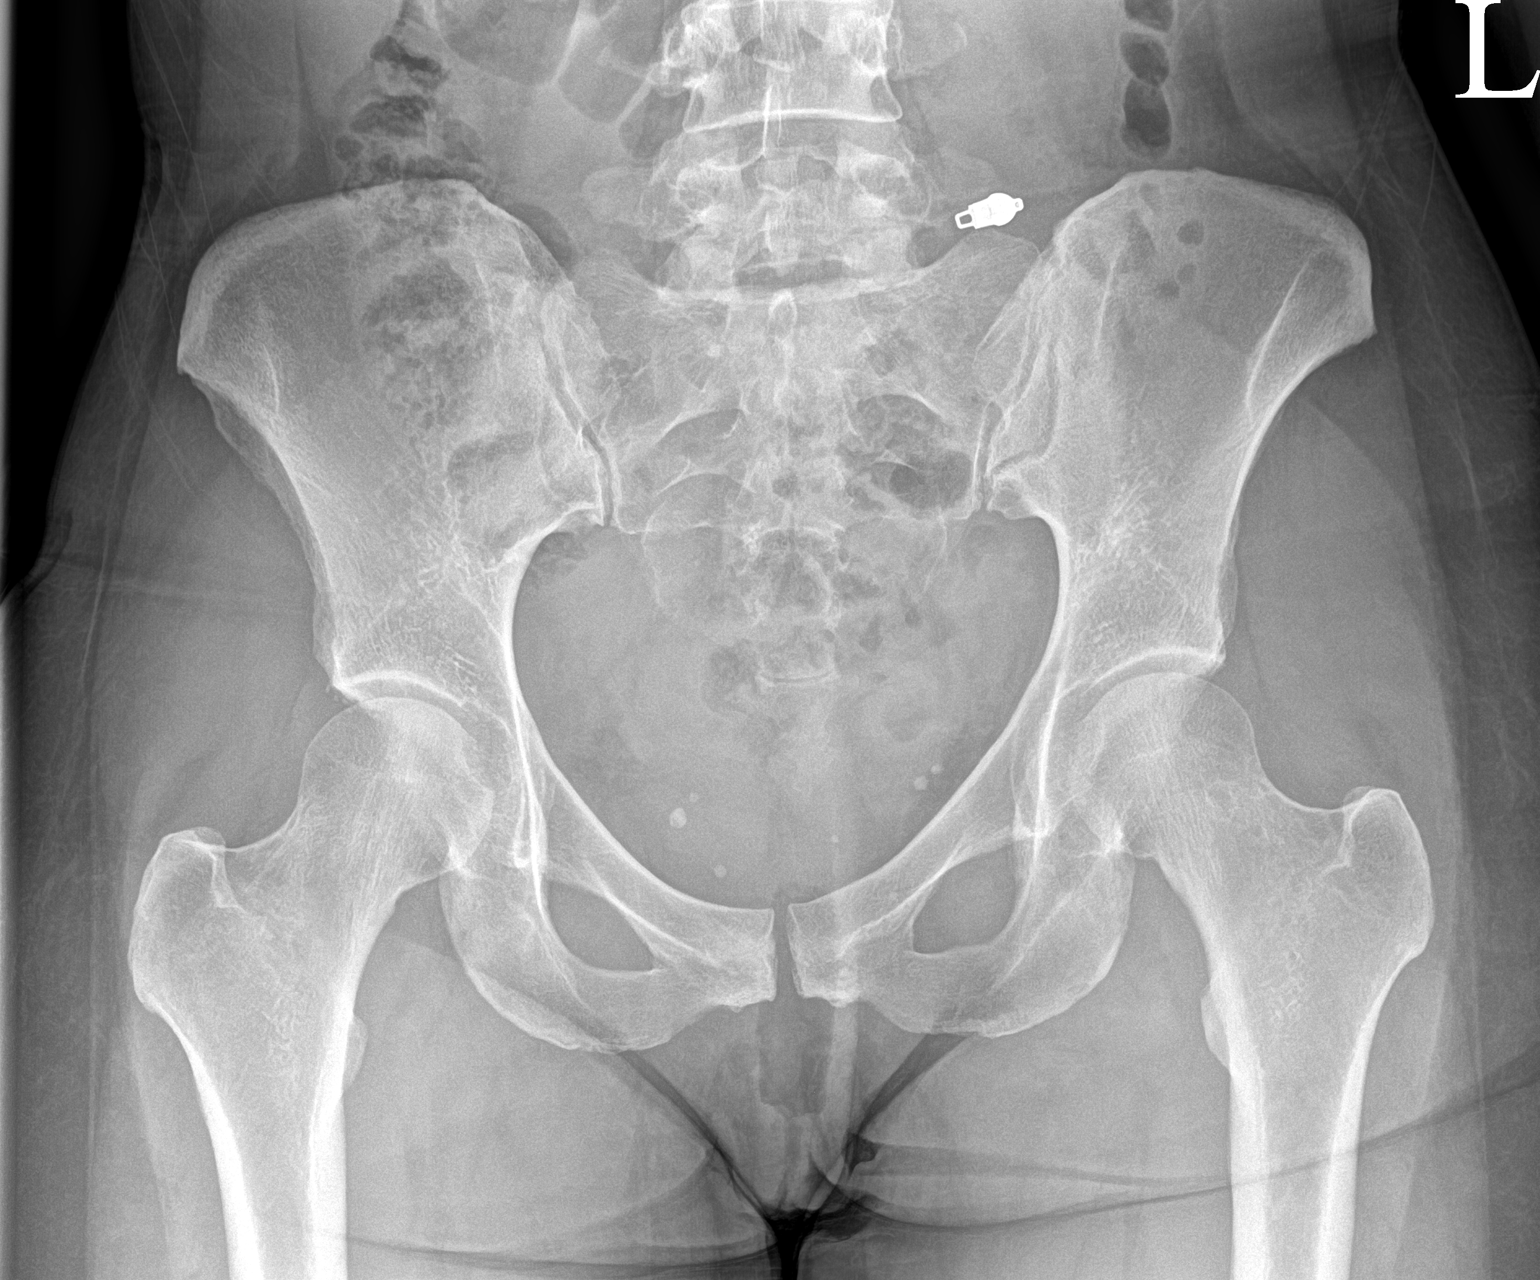

[1 of 1 positions shown; findings below may reference images not displayed]

FINDINGS: There is no evidence of pelvic fracture or diastasis. No pelvic bone
lesions are seen.
IMPRESSION: Negative.

## 2022-08-14 NOTE — Patient Instructions (Signed)
See me in 6 weeks Make sure the move does not do a # on you

## 2022-08-14 NOTE — Assessment & Plan Note (Addendum)
Chronic, with exacerbation noted today.  Did have an exacerbation noted that was concerning more secondary to the environment she was in.  Patient was in a different house working at a different desk that likely was contributing to some of the discomfort as well.  Discussed icing regimen and home exercises, discussed which activities to do and which ones to avoid.  Follow-up again in 6 to 8 weeks patient will be moving into the new house and likely this could possibly cause an exacerbation.  Discussed with the mechanics to see patient again as we 6 to 8 weeks

## 2022-09-23 NOTE — Progress Notes (Signed)
  [name] [address] [place] [address] Phone: [phone] Subjective:   [name], am serving as a scribe for Dr. Antoine Primas.  I'm seeing this patient by the request  of:  Patient, No Pcp Per  CC: back and neck pain   [id number]  Tempestt Dorrance is a 42 y.o. female coming in with complaint of back and neck pain. OMT on 08/14/2022. Patient states same per usual.  Mild some increase in tightness secondary to doing some spring cleaning.  Medications patient has been prescribed:   Taking:         Reviewed prior external information including notes and imaging from previsou exam, outside providers and external EMR if available.   As well as notes that were available from care everywhere and other healthcare systems.  Past medical history, social, surgical and family history all reviewed in electronic medical record.  No pertanent information unless stated regarding to the chief complaint.   No past medical history on file.  No Known Allergies   Review of Systems:  No headache, visual changes, nausea, vomiting, diarrhea, constipation, dizziness, abdominal pain, skin rash, fevers, chills, night sweats, weight loss, swollen lymph nodes, body aches, joint swelling, chest pain, shortness of breath, mood changes. POSITIVE muscle aches  Objective  Blood pressure (!) 142/92, pulse 75, height 5\' 2"  (1.575 m), weight 129 lb (58.5 kg), SpO2 99 %.   General: No apparent distress alert and oriented x3 mood and affect normal, dressed appropriately.  HEENT: Pupils equal, extraocular movements intact  Respiratory: Patient's speak in full sentences and does not appear short of breath  Cardiovascular: No lower extremity edema, non tender, no erythema  Low back still has tightness noted, right greater than left.  Tightness with Pearlean Brownie right greater than left.  Osteopathic findings  T3 extended rotated and side bent right  T9 extended  rotated and side bent left L2 flexed rotated and side bent right Sacrum right on right     Assessment and Plan:  Right hip pain Hip pain seems to be more around the sacroiliac joint at this moment.  Discussed posture and ergonomics, core strengthening again at this time.  Tightness with Pearlean Brownie still noted.  Will follow-up again in 6 to 8 weeks    Nonallopathic problems  Decision today to treat with OMT was based on Physical Exam  After verbal consent patient was treated with HVLA, ME, FPR techniques in thoracic, lumbar, and sacral  areas  Patient tolerated the procedure well with improvement in symptoms  Patient given exercises, stretches and lifestyle modifications  See medications in patient instructions if given  Patient will follow up in 4-8 weeks     The above documentation has been reviewed and is accurate and complete Judi Saa, DO         Note: This dictation was prepared with Dragon dictation along with smaller phrase technology. Any transcriptional errors that result from this process are unintentional.

## 2022-09-29 ENCOUNTER — Encounter: Payer: Self-pay | Admitting: Family Medicine

## 2022-09-29 ENCOUNTER — Ambulatory Visit: Payer: BC Managed Care – PPO | Admitting: Family Medicine

## 2022-09-29 VITALS — BP 142/92 | HR 75 | Ht 62.0 in | Wt 129.0 lb

## 2022-09-29 DIAGNOSIS — M25551 Pain in right hip: Secondary | ICD-10-CM

## 2022-09-29 DIAGNOSIS — M9903 Segmental and somatic dysfunction of lumbar region: Secondary | ICD-10-CM | POA: Diagnosis not present

## 2022-09-29 DIAGNOSIS — M9902 Segmental and somatic dysfunction of thoracic region: Secondary | ICD-10-CM | POA: Diagnosis not present

## 2022-09-29 DIAGNOSIS — M9904 Segmental and somatic dysfunction of sacral region: Secondary | ICD-10-CM

## 2022-09-29 NOTE — Assessment & Plan Note (Signed)
Hip pain seems to be more around the sacroiliac joint at this moment.  Discussed posture and ergonomics, core strengthening again at this time.  Tightness with Pearlean Brownie still noted.  Will follow-up again in 6 to 8 weeks

## 2022-09-29 NOTE — Patient Instructions (Signed)
Great to see you  So so much better See me again in 8-10 weeks

## 2022-11-23 NOTE — Progress Notes (Unsigned)
  [name] [address] [place] [address] Phone: [phone] Subjective:   [name], am serving as a scribe for Dr. Antoine Primas.  I'm seeing this patient by the request  of:  Patient, No Pcp Per  CC: back and neck pain follow up   [id number]  Melanie [name] is a 42 y.o. female coming in with complaint of back and neck pain. OMT on 09/29/2022. Patient states doing well. Same per usual. No new concerns  Medications patient has been prescribed:   Taking:         Reviewed prior external information including notes and imaging from previsou exam, outside providers and external EMR if available.   As well as notes that were available from care everywhere and other healthcare systems.  Past medical history, social, surgical and family history all reviewed in electronic medical record.  No pertanent information unless stated regarding to the chief complaint.   No past medical history on file.  No Known Allergies   Review of Systems:  No headache, visual changes, nausea, vomiting, diarrhea, constipation, dizziness, abdominal pain, skin rash, fevers, chills, night sweats, weight loss, swollen lymph nodes, body aches, joint swelling, chest pain, shortness of breath, mood changes. POSITIVE muscle aches  Objective  Blood pressure (!) 126/90, pulse (!) 59, height 5\' 2"  (1.575 m), weight 131 lb (59.4 kg), SpO2 99 %.   General: No apparent distress alert and oriented x3 mood and affect normal, dressed appropriately.  HEENT: Pupils equal, extraocular movements intact  Respiratory: Patient's speak in full sentences and does not appear short of breath  Cardiovascular: No lower extremity edema, non tender, no erythema  Back exam does have some loss of lordosis noted.  Some tenderness to palpation in the paraspinal musculature.  Osteopathic findings  T8 extended rotated and side bent left L2 flexed rotated and side bent right Sacrum right  on right       Assessment and Plan:  Right hip pain Continues to have significant tightness of the hip flexors as well as the right sacroiliac joint.  Discussed which activities to do and which ones to avoid.  Discussed avoiding certain activities over the course of time.  Patient has been continuing to try to do more stretching on a regular basis.  Follow-up with me again in 6 to 8 weeks    Nonallopathic problems  Decision today to treat with OMT was based on Physical Exam  After verbal consent patient was treated with HVLA, ME, FPR techniques in , thoracic, lumbar, and sacral  areas  Patient tolerated the procedure well with improvement in symptoms  Patient given exercises, stretches and lifestyle modifications  See medications in patient instructions if given  Patient will follow up in 4-8 weeks     The above documentation has been reviewed and is accurate and complete Judi Saa, DO         Note: This dictation was prepared with Dragon dictation along with smaller phrase technology. Any transcriptional errors that result from this process are unintentional.

## 2022-11-24 ENCOUNTER — Ambulatory Visit: Payer: BC Managed Care – PPO | Admitting: Family Medicine

## 2022-11-24 ENCOUNTER — Encounter: Payer: Self-pay | Admitting: Family Medicine

## 2022-11-24 VITALS — BP 126/90 | HR 59 | Ht 62.0 in | Wt 131.0 lb

## 2022-11-24 DIAGNOSIS — M25551 Pain in right hip: Secondary | ICD-10-CM | POA: Diagnosis not present

## 2022-11-24 DIAGNOSIS — M9903 Segmental and somatic dysfunction of lumbar region: Secondary | ICD-10-CM

## 2022-11-24 DIAGNOSIS — M9902 Segmental and somatic dysfunction of thoracic region: Secondary | ICD-10-CM | POA: Diagnosis not present

## 2022-11-24 DIAGNOSIS — M9904 Segmental and somatic dysfunction of sacral region: Secondary | ICD-10-CM

## 2022-11-24 NOTE — Patient Instructions (Signed)
Good to see you! Good luck stretching that re barb of a hip flexor See you again in 2-3 months

## 2022-11-24 NOTE — Assessment & Plan Note (Signed)
Continues to have significant tightness of the hip flexors as well as the right sacroiliac joint.  Discussed which activities to do and which ones to avoid.  Discussed avoiding certain activities over the course of time.  Patient has been continuing to try to do more stretching on a regular basis.  Follow-up with me again in 6 to 8 weeks

## 2023-01-20 NOTE — Progress Notes (Signed)
  [name] [address] [place] [address] Phone: [phone] Subjective:   Melanie [name], am serving as a scribe for Dr. Antoine Primas.  I'm seeing this patient by the request  of:  Patient, No Pcp Per  CC: Low back and neck pain follow-up  [id number]  Melanie [name] is a 42 y.o. female coming in with complaint of back and neck pain. OMT on 11/24/2022. Patient states same per usual. No new concerns.  Medications patient has been prescribed:   Taking:         Reviewed prior external information including notes and imaging from previsou exam, outside providers and external EMR if available.   As well as notes that were available from care everywhere and other healthcare systems.  Past medical history, social, surgical and family history all reviewed in electronic medical record.  No pertanent information unless stated regarding to the chief complaint.   No past medical history on file.  No Known Allergies   Review of Systems:  No headache, visual changes, nausea, vomiting, diarrhea, constipation, dizziness, abdominal pain, skin rash, fevers, chills, night sweats, weight loss, swollen lymph nodes, body aches, joint swelling, chest pain, shortness of breath, mood changes. POSITIVE muscle aches  Objective  Blood pressure 124/86, pulse 63, height 5\' 2"  (1.575 m), weight 133 lb (60.3 kg), SpO2 99%.   General: No apparent distress alert and oriented x3 mood and affect normal, dressed appropriately.  HEENT: Pupils equal, extraocular movements intact  Respiratory: Patient's speak in full sentences and does not appear short of breath  Cardiovascular: No lower extremity edema, non tender, no erythema  Right hip shows still some tenderness over the sacroiliac joint.  Mild positive FABER test noted on the right.  Patient does have some tenderness in the paraspinal musculature of the lumbar spine  Osteopathic findings  C7 flexed rotated and  side bent left T3 extended rotated and side bent right inhaled rib T7 extended rotated and side bent left L2 flexed rotated and side bent right L5 flexed rotated sidebent right Sacrum right on right       Assessment and Plan:  No problem-specific Assessment & Plan notes found for this encounter.    Nonallopathic problems  Decision today to treat with OMT was based on Physical Exam  After verbal consent patient was treated with HVLA, ME, FPR techniques in cervical, rib, thoracic, lumbar, and sacral  areas  Patient tolerated the procedure well with improvement in symptoms  Patient given exercises, stretches and lifestyle modifications  See medications in patient instructions if given  Patient will follow up in 4-8 weeks     The above documentation has been reviewed and is accurate and complete Judi Saa, DO         Note: This dictation was prepared with Dragon dictation along with smaller phrase technology. Any transcriptional errors that result from this process are unintentional.

## 2023-01-26 ENCOUNTER — Encounter: Payer: Self-pay | Admitting: Family Medicine

## 2023-01-26 ENCOUNTER — Ambulatory Visit: Payer: BC Managed Care – PPO | Admitting: Family Medicine

## 2023-01-26 VITALS — BP 124/86 | HR 63 | Ht 62.0 in | Wt 133.0 lb

## 2023-01-26 DIAGNOSIS — M9904 Segmental and somatic dysfunction of sacral region: Secondary | ICD-10-CM | POA: Diagnosis not present

## 2023-01-26 DIAGNOSIS — M9902 Segmental and somatic dysfunction of thoracic region: Secondary | ICD-10-CM | POA: Diagnosis not present

## 2023-01-26 DIAGNOSIS — M9908 Segmental and somatic dysfunction of rib cage: Secondary | ICD-10-CM

## 2023-01-26 DIAGNOSIS — M25551 Pain in right hip: Secondary | ICD-10-CM

## 2023-01-26 DIAGNOSIS — M9903 Segmental and somatic dysfunction of lumbar region: Secondary | ICD-10-CM | POA: Diagnosis not present

## 2023-01-26 DIAGNOSIS — M9901 Segmental and somatic dysfunction of cervical region: Secondary | ICD-10-CM | POA: Diagnosis not present

## 2023-01-26 NOTE — Assessment & Plan Note (Signed)
Low back still has some tightness noted.  Did have some tightness around the neck also noted today.  The patient does have a negative straight leg test.  Will continue to monitor.  Discussed posture numbers.  Has been lifting on a more regular basis at the moment.

## 2023-01-26 NOTE — Patient Instructions (Signed)
Good to see you! Restore the [place]! See you again in 2 months

## 2023-03-29 NOTE — Progress Notes (Deleted)
  [name] [address] [place] [address] Phone: [phone] Subjective:    I'm seeing this patient by the request  of:  Patient, No Pcp Per  CC:   [id number]  Melanie [name] is a 42 y.o. female coming in with complaint of back and neck pain. OMT on 01/26/2023. Patient states   Medications patient has been prescribed:   Taking:         Reviewed prior external information including notes and imaging from previsou exam, outside providers and external EMR if available.   As well as notes that were available from care everywhere and other healthcare systems.  Past medical history, social, surgical and family history all reviewed in electronic medical record.  No pertanent information unless stated regarding to the chief complaint.   No past medical history on file.  No Known Allergies   Review of Systems:  No headache, visual changes, nausea, vomiting, diarrhea, constipation, dizziness, abdominal pain, skin rash, fevers, chills, night sweats, weight loss, swollen lymph nodes, body aches, joint swelling, chest pain, shortness of breath, mood changes. POSITIVE muscle aches  Objective  There were no vitals taken for this visit.   General: No apparent distress alert and oriented x3 mood and affect normal, dressed appropriately.  HEENT: Pupils equal, extraocular movements intact  Respiratory: Patient's speak in full sentences and does not appear short of breath  Cardiovascular: No lower extremity edema, non tender, no erythema  Gait MSK:  Back   Osteopathic findings  C2 flexed rotated and side bent right C6 flexed rotated and side bent left T3 extended rotated and side bent right inhaled rib T9 extended rotated and side bent left L2 flexed rotated and side bent right Sacrum right on right       Assessment and Plan:  No problem-specific Assessment & Plan notes found for this encounter.    Nonallopathic  problems  Decision today to treat with OMT was based on Physical Exam  After verbal consent patient was treated with HVLA, ME, FPR techniques in cervical, rib, thoracic, lumbar, and sacral  areas  Patient tolerated the procedure well with improvement in symptoms  Patient given exercises, stretches and lifestyle modifications  See medications in patient instructions if given  Patient will follow up in 4-8 weeks             Note: This dictation was prepared with Dragon dictation along with smaller phrase technology. Any transcriptional errors that result from this process are unintentional.

## 2023-03-30 ENCOUNTER — Ambulatory Visit: Payer: BC Managed Care – PPO | Admitting: Family Medicine

## 2023-04-21 NOTE — Progress Notes (Unsigned)
  [name] [address] [place] [address] Phone: [phone] Subjective:    I'm seeing this patient by the request  of:  Patient, No Pcp Per  CC:   [id number]  Melanie [name] is a 42 y.o. female coming in with complaint of back and neck pain. OMT on 01/26/2023. Patient states   Medications patient has been prescribed:   Taking:         Reviewed prior external information including notes and imaging from previsou exam, outside providers and external EMR if available.   As well as notes that were available from care everywhere and other healthcare systems.  Past medical history, social, surgical and family history all reviewed in electronic medical record.  No pertanent information unless stated regarding to the chief complaint.   No past medical history on file.  No Known Allergies   Review of Systems:  No headache, visual changes, nausea, vomiting, diarrhea, constipation, dizziness, abdominal pain, skin rash, fevers, chills, night sweats, weight loss, swollen lymph nodes, body aches, joint swelling, chest pain, shortness of breath, mood changes. POSITIVE muscle aches  Objective  There were no vitals taken for this visit.   General: No apparent distress alert and oriented x3 mood and affect normal, dressed appropriately.  HEENT: Pupils equal, extraocular movements intact  Respiratory: Patient's speak in full sentences and does not appear short of breath  Cardiovascular: No lower extremity edema, non tender, no erythema  Gait MSK:  Back   Osteopathic findings  C2 flexed rotated and side bent right C6 flexed rotated and side bent left T3 extended rotated and side bent right inhaled rib T9 extended rotated and side bent left L2 flexed rotated and side bent right Sacrum right on right       Assessment and Plan:  No problem-specific Assessment & Plan notes found for this encounter.    Nonallopathic  problems  Decision today to treat with OMT was based on Physical Exam  After verbal consent patient was treated with HVLA, ME, FPR techniques in cervical, rib, thoracic, lumbar, and sacral  areas  Patient tolerated the procedure well with improvement in symptoms  Patient given exercises, stretches and lifestyle modifications  See medications in patient instructions if given  Patient will follow up in 4-8 weeks             Note: This dictation was prepared with Dragon dictation along with smaller phrase technology. Any transcriptional errors that result from this process are unintentional.

## 2023-04-22 ENCOUNTER — Encounter: Payer: Self-pay | Admitting: Family Medicine

## 2023-04-22 ENCOUNTER — Ambulatory Visit: Payer: BC Managed Care – PPO | Admitting: Family Medicine

## 2023-04-22 VITALS — BP 112/86 | HR 61 | Ht 62.0 in | Wt 134.0 lb

## 2023-04-22 DIAGNOSIS — M9904 Segmental and somatic dysfunction of sacral region: Secondary | ICD-10-CM

## 2023-04-22 DIAGNOSIS — M9903 Segmental and somatic dysfunction of lumbar region: Secondary | ICD-10-CM | POA: Diagnosis not present

## 2023-04-22 DIAGNOSIS — M9908 Segmental and somatic dysfunction of rib cage: Secondary | ICD-10-CM

## 2023-04-22 DIAGNOSIS — M25551 Pain in right hip: Secondary | ICD-10-CM | POA: Diagnosis not present

## 2023-04-22 DIAGNOSIS — M9902 Segmental and somatic dysfunction of thoracic region: Secondary | ICD-10-CM

## 2023-04-22 DIAGNOSIS — M9901 Segmental and somatic dysfunction of cervical region: Secondary | ICD-10-CM | POA: Diagnosis not present

## 2023-04-22 NOTE — Assessment & Plan Note (Signed)
Continue mild tightness noted in the right hip as well as the upper neck today.  Discussed icing regimen and home exercises.  No change in medications at the moment.  Patient will be traveling so would like to see her little sooner and closer to more of a 6 to 8-week interval.  Patient knows if any worsening pain or radicular symptoms we will see what else we need to do.  Follow-up again as stated in 6 to 8 weeks.

## 2023-04-22 NOTE — Patient Instructions (Signed)
Enjoy [place] See you again in 6-8 weeks

## 2023-06-08 ENCOUNTER — Ambulatory Visit: Payer: BC Managed Care – PPO | Admitting: Family Medicine

## 2023-07-16 NOTE — Progress Notes (Unsigned)
  [name] [address] [place] [address] Phone: [phone] Subjective:   [name], am serving as a scribe for Dr. Antoine Primas.  I'm seeing this patient by the request  of:  Patient, No Pcp Per  CC: Back and neck pain  [id number]  Melanie [name] is a 43 y.o. female coming in with complaint of back and neck pain. OMT on 04/22/2023. Patient states same musculoskeletal concerns. No new symptoms.  Medications patient has been prescribed:   Taking:         Reviewed prior external information including notes and imaging from previsou exam, outside providers and external EMR if available.   As well as notes that were available from care everywhere and other healthcare systems.  Past medical history, social, surgical and family history all reviewed in electronic medical record.  No pertanent information unless stated regarding to the chief complaint.      Review of Systems:  No headache, visual changes, nausea, vomiting, diarrhea, constipation, dizziness, abdominal pain, skin rash, fevers, chills, night sweats, weight loss, swollen lymph nodes, body aches, joint swelling, chest pain, shortness of breath, mood changes. POSITIVE muscle aches  Objective  Blood pressure 122/78, height 5\' 2"  (1.575 m), weight 136 lb (61.7 kg).   General: No apparent distress alert and oriented x3 mood and affect normal, dressed appropriately.  HEENT: Pupils equal, extraocular movements intact  Respiratory: Patient's speak in full sentences and does not appear short of breath  Cardiovascular: No lower extremity edema, non tender, no erythema  MSK:  Back does have some loss of lordosis and some tenderness over the right hip area.  Osteopathic findings  C2 flexed rotated and side bent right C6 flexed rotated and side bent left T3 extended rotated and side bent right inhaled rib T9 extended rotated and side bent left L2 flexed rotated and side  bent right L3 flexed rotated and side bent left Sacrum right on right Patient did have a posterior left ilium      Assessment and Plan:  Right hip pain Some tightness noted.  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  Seems to be on the contralateral side at the moment.  Discussed icing regimen and home exercises.  Discussed which activities to do and which ones to avoid.  Increase activity slowly.  Follow-up again in 6 to 8 weeks.    Nonallopathic problems  Decision today to treat with OMT was based on Physical Exam  After verbal consent patient was treated with HVLA, ME, FPR techniques in cervical, rib, thoracic, lumbar, and sacral and pelvis areas  Patient tolerated the procedure well with improvement in symptoms  Patient given exercises, stretches and lifestyle modifications  See medications in patient instructions if given  Patient will follow up in 4-8 weeks    The above documentation has been reviewed and is accurate and complete Judi Saa, DO          Note: This dictation was prepared with Dragon dictation along with smaller phrase technology. Any transcriptional errors that result from this process are unintentional.

## 2023-07-20 ENCOUNTER — Encounter: Payer: Self-pay | Admitting: Family Medicine

## 2023-07-20 ENCOUNTER — Ambulatory Visit: Payer: BC Managed Care – PPO | Admitting: Family Medicine

## 2023-07-20 VITALS — BP 122/78 | Ht 62.0 in | Wt 136.0 lb

## 2023-07-20 DIAGNOSIS — M9904 Segmental and somatic dysfunction of sacral region: Secondary | ICD-10-CM

## 2023-07-20 DIAGNOSIS — M25551 Pain in right hip: Secondary | ICD-10-CM

## 2023-07-20 DIAGNOSIS — M9901 Segmental and somatic dysfunction of cervical region: Secondary | ICD-10-CM

## 2023-07-20 DIAGNOSIS — M9903 Segmental and somatic dysfunction of lumbar region: Secondary | ICD-10-CM | POA: Diagnosis not present

## 2023-07-20 DIAGNOSIS — M9902 Segmental and somatic dysfunction of thoracic region: Secondary | ICD-10-CM

## 2023-07-20 DIAGNOSIS — M9908 Segmental and somatic dysfunction of rib cage: Secondary | ICD-10-CM | POA: Diagnosis not present

## 2023-07-20 NOTE — Assessment & Plan Note (Signed)
Some tightness noted.  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  Seems to be on the contralateral side at the moment.  Discussed icing regimen and home exercises.  Discussed which activities to do and which ones to avoid.  Increase activity slowly.  Follow-up again in 6 to 8 weeks.

## 2023-07-20 NOTE — Patient Instructions (Signed)
Good to see you! Careful with the wordle See you again in 7-8 weeks

## 2023-08-30 NOTE — Progress Notes (Unsigned)
  [name] [address] [place] [address] Phone: [phone] Subjective:   Melanie [name], am serving as a scribe for Dr. Antoine Primas.  I'm seeing this patient by the request  of:  Patient, No Pcp Per  CC: Back and neck pain follow-up  [id number]  Melanie [name] is a 43 y.o. female coming in with complaint of back and neck pain. OMT On 07/20/2023. Patient states has done relatively well overall.  Some mild discomfort and pain but nothing severe.  Still running approximately 20 miles a week.          Reviewed prior external information including notes and imaging from previsou exam, outside providers and external EMR if available.   As well as notes that were available from care everywhere and other healthcare systems.  Past medical history, social, surgical and family history all reviewed in electronic medical record.  No pertanent information unless stated regarding to the chief complaint.   No past medical history on file.  No Known Allergies   Review of Systems:  No headache, visual changes, nausea, vomiting, diarrhea, constipation, dizziness, abdominal pain, skin rash, fevers, chills, night sweats, weight loss, swollen lymph nodes, body aches, joint swelling, chest pain, shortness of breath, mood changes. POSITIVE muscle aches  Objective  Blood pressure 132/74, pulse (!) 59, height 5\' 2"  (1.575 m), weight 136 lb (61.7 kg), SpO2 97%.   General: No apparent distress alert and oriented x3 mood and affect normal, dressed appropriately.  HEENT: Pupils equal, extraocular movements intact  Respiratory: Patient's speak in full sentences and does not appear short of breath  Cardiovascular: No lower extremity edema, non tender, no erythema  Gait MSK:  Back does have some loss lordosis noted.  Some tenderness to palpation in the paraspinal musculature.  Patient has some tightness noted but some improvement in grip strength  noted.  Osteopathic findings  C2 flexed rotated and side bent right C7 flexed rotated and side bent left T3 extended rotated and side bent right inhaled rib T7 extended rotated and side bent left L3 flexed rotated and side bent right L4 flexed rotated and side bent left Sacrum right on right       Assessment and Plan:  Right hip pain Continues to have some pain over the right sacroiliac joint but overall is doing very well.  Responding extremely well to osteopathic manipulation.  Significant improvement in core strength and hip abductor strengthening noted as well.  Discussed icing regimen and home exercises.  Follow-up again in 6 to 8 weeks otherwise.    Nonallopathic problems  Decision today to treat with OMT was based on Physical Exam  After verbal consent patient was treated with HVLA, ME, FPR techniques in cervical, rib, thoracic, lumbar, and sacral  areas  Patient tolerated the procedure well with improvement in symptoms  Patient given exercises, stretches and lifestyle modifications  See medications in patient instructions if given  Patient will follow up in 4-8 weeks    The above documentation has been reviewed and is accurate and complete Judi Saa, DO          Note: This dictation was prepared with Dragon dictation along with smaller phrase technology. Any transcriptional errors that result from this process are unintentional.

## 2023-08-31 ENCOUNTER — Encounter: Payer: Self-pay | Admitting: Family Medicine

## 2023-08-31 ENCOUNTER — Ambulatory Visit: Payer: BC Managed Care – PPO | Admitting: Family Medicine

## 2023-08-31 VITALS — BP 132/74 | HR 59 | Ht 62.0 in | Wt 136.0 lb

## 2023-08-31 DIAGNOSIS — M9902 Segmental and somatic dysfunction of thoracic region: Secondary | ICD-10-CM | POA: Diagnosis not present

## 2023-08-31 DIAGNOSIS — M9904 Segmental and somatic dysfunction of sacral region: Secondary | ICD-10-CM

## 2023-08-31 DIAGNOSIS — M9901 Segmental and somatic dysfunction of cervical region: Secondary | ICD-10-CM

## 2023-08-31 DIAGNOSIS — M25551 Pain in right hip: Secondary | ICD-10-CM

## 2023-08-31 DIAGNOSIS — M9903 Segmental and somatic dysfunction of lumbar region: Secondary | ICD-10-CM | POA: Diagnosis not present

## 2023-08-31 DIAGNOSIS — M9908 Segmental and somatic dysfunction of rib cage: Secondary | ICD-10-CM | POA: Diagnosis not present

## 2023-08-31 NOTE — Patient Instructions (Signed)
 Enjoy [place] Doing amazing See me in 3 months

## 2023-08-31 NOTE — Assessment & Plan Note (Signed)
 Continues to have some pain over the right sacroiliac joint but overall is doing very well.  Responding extremely well to osteopathic manipulation.  Significant improvement in core strength and hip abductor strengthening noted as well.  Discussed icing regimen and home exercises.  Follow-up again in 6 to 8 weeks otherwise.

## 2023-11-30 NOTE — Progress Notes (Unsigned)
  [facility] [address] [place] [address] Phone: [phone] Subjective:   Delwyn Filippo, am serving as a scribe for Dr. Ronnell Coins.  I'm seeing this patient by the request  of:  Patient, No Pcp Per  CC: back and neck pain follow up   [id number]  Valoria Ulloa is a 43 y.o. female coming in with complaint of back and neck pain. OMT on 08/31/2023. Patient states that she is doing well since last visit.   Medications patient has been prescribed:   Taking:       Reviewed prior external information including notes and imaging from previsou exam, outside providers and external EMR if available.   As well as notes that were available from care everywhere and other healthcare systems.  Past medical history, social, surgical and family history all reviewed in electronic medical record.  No pertanent information unless stated regarding to the chief complaint.   No past medical history on file.  No Known Allergies   Review of Systems:  No headache, visual changes, nausea, vomiting, diarrhea, constipation, dizziness, abdominal pain, skin rash, fevers, chills, night sweats, weight loss, swollen lymph nodes, body aches, joint swelling, chest pain, shortness of breath, mood changes. POSITIVE muscle aches  Objective  Blood pressure 124/88, pulse 69, height 5' 2 (1.575 m), weight 133 lb (60.3 kg), SpO2 97%.   General: No apparent distress alert and oriented x3 mood and affect normal, dressed appropriately.  HEENT: Pupils equal, extraocular movements intact  Respiratory: Patient's speak in full sentences and does not appear short of breath  Cardiovascular: No lower extremity edema, non tender, no erythema  Gait relatively normal. MSK:  Back does have some loss lordosis.  Significant tightness noted in the parascapular area right greater than left.  Some limited sidebending noted.  Osteopathic findings  C2 flexed rotated and side bent  right C6 flexed rotated and side bent left T3 extended rotated and side bent right inhaled rib T9 extended rotated and side bent left L2 flexed rotated and side bent right L3 flexed rotated and side bent left Sacrum right on right     Assessment and Plan:  No problem-specific Assessment & Plan notes found for this encounter.    Nonallopathic problems  Decision today to treat with OMT was based on Physical Exam  After verbal consent patient was treated with HVLA, ME, FPR techniques in cervical, rib, thoracic, lumbar, and sacral  areas  Patient tolerated the procedure well with improvement in symptoms  Patient given exercises, stretches and lifestyle modifications  See medications in patient instructions if given  Patient will follow up in 4-8 weeks    The above documentation has been reviewed and is accurate and complete Bettyjean Stefanski M Waleska Buttery, DO          Note: This dictation was prepared with Dragon dictation along with smaller phrase technology. Any transcriptional errors that result from this process are unintentional.

## 2023-12-01 ENCOUNTER — Encounter: Payer: Self-pay | Admitting: Family Medicine

## 2023-12-01 ENCOUNTER — Ambulatory Visit: Admitting: Family Medicine

## 2023-12-01 VITALS — BP 124/88 | HR 69 | Ht 62.0 in | Wt 133.0 lb

## 2023-12-01 DIAGNOSIS — M9908 Segmental and somatic dysfunction of rib cage: Secondary | ICD-10-CM | POA: Diagnosis not present

## 2023-12-01 DIAGNOSIS — G2589 Other specified extrapyramidal and movement disorders: Secondary | ICD-10-CM | POA: Insufficient documentation

## 2023-12-01 DIAGNOSIS — M9903 Segmental and somatic dysfunction of lumbar region: Secondary | ICD-10-CM | POA: Diagnosis not present

## 2023-12-01 DIAGNOSIS — M9901 Segmental and somatic dysfunction of cervical region: Secondary | ICD-10-CM | POA: Diagnosis not present

## 2023-12-01 DIAGNOSIS — M9902 Segmental and somatic dysfunction of thoracic region: Secondary | ICD-10-CM

## 2023-12-01 DIAGNOSIS — M9904 Segmental and somatic dysfunction of sacral region: Secondary | ICD-10-CM

## 2023-12-01 NOTE — Assessment & Plan Note (Signed)
 Scapular dyskinesis noted.  Discussed posture and ergonomics, has responded well to osteopathic manipulation previously and hopefully will again.  Discussed which activities to do and which ones to avoid.  Increase activity slowly.  Discussed icing regimen.  Follow-up with me again in 6 to 8 weeks.  May have some worsening discomfort and pain would need to consider imaging and medications.

## 2023-12-01 NOTE — Patient Instructions (Addendum)
 Trigger point injections See me in August and end of Sept

## 2024-01-25 NOTE — Progress Notes (Unsigned)
  [facility] [address] [place] [address] Phone: [phone] Subjective:   LILLETTE Berwyn Posey, am serving as a scribe for Dr. Arthea Claudene.  I'm seeing this patient by the request  of:  Patient, No Pcp Per  CC: Back and neck pain,  YEP:[name]  Lavilla Shimada is a 43 y.o. female coming in with complaint of back and neck pain. OMT on 12/01/2023. Patient states that she has been doing fine since last visit. She feels like it is time for manipulation.   Medications patient has been prescribed:   Taking:         Reviewed prior external information including notes and imaging from previsou exam, outside providers and external EMR if available.   As well as notes that were available from care everywhere and other healthcare systems.  Past medical history, social, surgical and family history all reviewed in electronic medical record.  No pertanent information unless stated regarding to the chief complaint.   No past medical history on file.  No Known Allergies   Review of Systems:  No headache, visual changes, nausea, vomiting, diarrhea, constipation, dizziness, abdominal pain, skin rash, fevers, chills, night sweats, weight loss, swollen lymph nodes, body aches, joint swelling, chest pain, shortness of breath, mood changes. POSITIVE muscle aches  Objective  Blood pressure 128/88, pulse 67, height 5' 2 (1.575 m), weight 133 lb (60.3 kg), SpO2 98%.   General: No apparent distress alert and oriented x3 mood and affect normal, dressed appropriately.  HEENT: Pupils equal, extraocular movements intact  Respiratory: Patient's speak in full sentences and does not appear short of breath  Cardiovascular: No lower extremity edema, non tender, no erythema  Gait relatively normal MSK:  Back does have some mild loss lordosis.  Significant tightness noted of the periscapular area near the cervical thoracic area bilaterally left greater than  right.  Osteopathic findings  C2 flexed rotated and side bent right C6 flexed rotated and side bent left C7 flexed rotated and side bent right T3 extended rotated and side bent right inhaled rib T9 extended rotated and side bent left L2 flexed rotated and side bent right Sacrum right on right    Assessment and Plan:  Scapular dyskinesis Continues to have some difficulty.  Discussed icing regimen and home exercises.  Continue to work on the scapular area.  Increase activity slowly.  Follow-up again in 6 to 8 weeks.  No change in medications.    Nonallopathic problems  Decision today to treat with OMT was based on Physical Exam  After verbal consent patient was treated with HVLA, ME, FPR techniques in cervical, rib, thoracic, lumbar, and sacral  areas  Patient tolerated the procedure well with improvement in symptoms  Patient given exercises, stretches and lifestyle modifications  See medications in patient instructions if given  Patient will follow up in 6-8 weeks    The above documentation has been reviewed and is accurate and complete Charlene Detter M Abbe Bula, DO          Note: This dictation was prepared with Dragon dictation along with smaller phrase technology. Any transcriptional errors that result from this process are unintentional.

## 2024-01-26 ENCOUNTER — Ambulatory Visit: Admitting: Family Medicine

## 2024-01-26 ENCOUNTER — Encounter: Payer: Self-pay | Admitting: Family Medicine

## 2024-01-26 VITALS — BP 128/88 | HR 67 | Ht 62.0 in | Wt 133.0 lb

## 2024-01-26 DIAGNOSIS — M9908 Segmental and somatic dysfunction of rib cage: Secondary | ICD-10-CM | POA: Diagnosis not present

## 2024-01-26 DIAGNOSIS — G2589 Other specified extrapyramidal and movement disorders: Secondary | ICD-10-CM

## 2024-01-26 DIAGNOSIS — M9903 Segmental and somatic dysfunction of lumbar region: Secondary | ICD-10-CM

## 2024-01-26 DIAGNOSIS — M9904 Segmental and somatic dysfunction of sacral region: Secondary | ICD-10-CM

## 2024-01-26 DIAGNOSIS — M9901 Segmental and somatic dysfunction of cervical region: Secondary | ICD-10-CM | POA: Diagnosis not present

## 2024-01-26 DIAGNOSIS — M9902 Segmental and somatic dysfunction of thoracic region: Secondary | ICD-10-CM | POA: Diagnosis not present

## 2024-01-26 NOTE — Assessment & Plan Note (Signed)
 Continues to have some difficulty.  Discussed icing regimen and home exercises.  Continue to work on the scapular area.  Increase activity slowly.  Follow-up again in 6 to 8 weeks.  No change in medications.

## 2024-03-16 NOTE — Progress Notes (Deleted)
  [facility] [address] [place] [address] Phone: [phone] Subjective:    I'm seeing this patient by the request  of:  Patient, No Pcp Per  CC:   YEP:[name]  Setareh Borrero is a 43 y.o. female coming in with complaint of back and neck pain. OMT on 01/26/2024. Patient states   Medications patient has been prescribed:   Taking:         Reviewed prior external information including notes and imaging from previsou exam, outside providers and external EMR if available.   As well as notes that were available from care everywhere and other healthcare systems.  Past medical history, social, surgical and family history all reviewed in electronic medical record.  No pertanent information unless stated regarding to the chief complaint.   No past medical history on file.  No Known Allergies   Review of Systems:  No headache, visual changes, nausea, vomiting, diarrhea, constipation, dizziness, abdominal pain, skin rash, fevers, chills, night sweats, weight loss, swollen lymph nodes, body aches, joint swelling, chest pain, shortness of breath, mood changes. POSITIVE muscle aches  Objective  There were no vitals taken for this visit.   General: No apparent distress alert and oriented x3 mood and affect normal, dressed appropriately.  HEENT: Pupils equal, extraocular movements intact  Respiratory: Patient's speak in full sentences and does not appear short of breath  Cardiovascular: No lower extremity edema, non tender, no erythema  Gait MSK:  Back   Osteopathic findings  C2 flexed rotated and side bent right C6 flexed rotated and side bent left T3 extended rotated and side bent right inhaled rib T9 extended rotated and side bent left L2 flexed rotated and side bent right Sacrum right on right       Assessment and Plan:  No problem-specific Assessment & Plan notes found for this encounter.    Nonallopathic  problems  Decision today to treat with OMT was based on Physical Exam  After verbal consent patient was treated with HVLA, ME, FPR techniques in cervical, rib, thoracic, lumbar, and sacral  areas  Patient tolerated the procedure well with improvement in symptoms  Patient given exercises, stretches and lifestyle modifications  See medications in patient instructions if given  Patient will follow up in 4-8 weeks             Note: This dictation was prepared with Dragon dictation along with smaller phrase technology. Any transcriptional errors that result from this process are unintentional.

## 2024-03-21 ENCOUNTER — Ambulatory Visit: Admitting: Family Medicine

## 2024-04-11 NOTE — Progress Notes (Addendum)
 [facility] [address] [place] [address] Phone: [phone] Subjective:   Melanie [name], am serving as a scribe for Dr. Arthea Claudene.  I'm seeing this patient by the request  of:  Patient, No Pcp Per  CC: Back and neck pain follow-up  YEP:[name]  Melanie [name] is a 43 y.o. female coming in with complaint of back and neck pain. OMT on 01/26/2024. Patient states doing well. No new symptoms.  Did do some recent traveling and feels that that did cause some more tightness noted on the right side of the neck.  Has noticed some decrease in rotation to the right and pain going down the shoulder blade.  Denies any pain down the arm itself though.  Medications patient has been prescribed: None  Taking:         Reviewed prior external information including notes and imaging from previsou exam, outside providers and external EMR if available.   As well as notes that were available from care everywhere and other healthcare systems.  Past medical history, social, surgical and family history all reviewed in electronic medical record.  No pertanent information unless stated regarding to the chief complaint.   No past medical history on file.  No Known Allergies   Review of Systems:  No headache, visual changes, nausea, vomiting, diarrhea, constipation, dizziness, abdominal pain, skin rash, fevers, chills, night sweats, weight loss, swollen lymph nodes, body aches, joint swelling, chest pain, shortness of breath, mood changes. POSITIVE muscle aches  Objective  Blood pressure 124/86, pulse 68, height 5' 2 (1.575 m), weight 131 lb (59.4 kg), SpO2 98%.    General: No apparent distress alert and oriented x3 mood and affect normal, dressed appropriately.  HEENT: Pupils equal, extraocular movements intact  Respiratory: Patient's speak in full sentences and does not appear short of breath  Cardiovascular: No lower extremity edema, non tender, no  erythema  MSK:  Back still some scapular dyskinesis noted.  Still has some tightness noted in the lower back but seems to be more over the left sacroiliac joint.  Positive Faber on the left side.  After verbal consent patient was prepped with alcohol swab and with a 25-gauge half inch needle injected into 3 distinct trigger points on the right side and the level to scapula, trapezius and rhomboid musculature.  Minimal blood loss.  Band-Aid placed.  Total of 3 cc of 0.5% Marcaine and 1 cc of Kenalog 40 mg/mL used.  Postinjection instructions given.  Osteopathic findings  C2 flexed rotated and side bent right C7 flexed rotated and side bent left T3 extended rotated and side bent right inhaled rib T7 extended rotated and side bent left L2 flexed rotated and side bent right Sacrum r left on left    Assessment and Plan: Scapular dyskinesis Scapular dyskinesis but multiple trigger points noted today.  Given injections in the trigger point areas of the right shoulder region.  Warned of potential side effects, discussed icing regimen and home exercises, discussed which activities to do and which ones to avoid.  Increase activity slowly.  Discussed icing regimen.  Follow-up again in 6 to 8 weeks otherwise.  Trigger point of right shoulder region Injections given today.  We did the levator scapula, trapezius and rhomboid musculature.  Nonallopathic problems  Decision today to treat with OMT was based on Physical Exam  After verbal consent patient was treated with HVLA, ME, FPR techniques in cervical, rib, thoracic, lumbar, and sacral  areas  Patient tolerated the procedure well with improvement in symptoms  Patient given exercises, stretches and lifestyle modifications  See medications in patient instructions if given  Patient will follow up in 4-8 weeks    The above documentation has been reviewed and is accurate and complete Melanie Mcisaac M Wolf Boulay, DO         Note: This dictation was  prepared with Dragon dictation along with smaller phrase technology. Any transcriptional errors that result from this process are unintentional.

## 2024-04-13 ENCOUNTER — Ambulatory Visit: Admitting: Family Medicine

## 2024-04-13 ENCOUNTER — Encounter: Payer: Self-pay | Admitting: Family Medicine

## 2024-04-13 VITALS — BP 124/86 | HR 68 | Ht 62.0 in | Wt 131.0 lb

## 2024-04-13 DIAGNOSIS — M9904 Segmental and somatic dysfunction of sacral region: Secondary | ICD-10-CM | POA: Diagnosis not present

## 2024-04-13 DIAGNOSIS — M9901 Segmental and somatic dysfunction of cervical region: Secondary | ICD-10-CM

## 2024-04-13 DIAGNOSIS — M25511 Pain in right shoulder: Secondary | ICD-10-CM | POA: Diagnosis not present

## 2024-04-13 DIAGNOSIS — M9903 Segmental and somatic dysfunction of lumbar region: Secondary | ICD-10-CM

## 2024-04-13 DIAGNOSIS — M9908 Segmental and somatic dysfunction of rib cage: Secondary | ICD-10-CM

## 2024-04-13 DIAGNOSIS — G2589 Other specified extrapyramidal and movement disorders: Secondary | ICD-10-CM | POA: Diagnosis not present

## 2024-04-13 DIAGNOSIS — M9902 Segmental and somatic dysfunction of thoracic region: Secondary | ICD-10-CM

## 2024-04-13 NOTE — Patient Instructions (Signed)
 Trigger point injections today Good to see you! See you again in 2 months

## 2024-04-13 NOTE — Assessment & Plan Note (Signed)
 Injections given today.  We did the levator scapula, trapezius and rhomboid musculature.

## 2024-04-13 NOTE — Assessment & Plan Note (Signed)
 Scapular dyskinesis but multiple trigger points noted today.  Given injections in the trigger point areas of the right shoulder region.  Warned of potential side effects, discussed icing regimen and home exercises, discussed which activities to do and which ones to avoid.  Increase activity slowly.  Discussed icing regimen.  Follow-up again in 6 to 8 weeks otherwise.

## 2024-05-31 NOTE — Progress Notes (Signed)
°  [name] [address] [place] [address] Phone: [phone] Subjective:   Melanie [name], am serving as a scribe for Dr. Arthea Claudene.  I'm seeing this patient by the request  of:  Patient, No Pcp Per  CC: Back and neck pain follow-up  YEP:[name]  Melanie [name] is a 42 y.o. female coming in with complaint of back and neck pain. OMT 04/13/2024. Patient states doing well. No other symptoms. Much better than the last time she was here.  Medications patient has been prescribed: None  Taking:     Reviewing patient's chart outside labs did show significant low vitamin D level.    Reviewed prior external information including notes and imaging from previsou exam, outside providers and external EMR if available.   As well as notes that were available from care everywhere and other healthcare systems.  Past medical history, social, surgical and family history all reviewed in electronic medical record.  No pertanent information unless stated regarding to the chief complaint.   No past medical history on file.  No Known Allergies   Review of Systems:  No headache, visual changes, nausea, vomiting, diarrhea, constipation, dizziness, abdominal pain, skin rash, fevers, chills, night sweats, weight loss, swollen lymph nodes, body aches, joint swelling, chest pain, shortness of breath, mood changes. POSITIVE muscle aches  Objective  Blood pressure 128/86, pulse (!) 56, height 5' 2 (1.575 m), weight 134 lb (60.8 kg), SpO2 98%.   General: No apparent distress alert and oriented x3 mood and affect normal, dressed appropriately.  HEENT: Pupils equal, extraocular movements intact  Respiratory: Patient's speak in full sentences and does not appear short of breath  Cardiovascular: No lower extremity edema, non tender, no erythema  Gait MSK:  Back does have some loss lordosis noted.  Some tenderness to palpation in the paraspinal musculature.   Tightness noted in the paraspinal musculature.  Osteopathic findings  C2 flexed rotated and side bent right C5 flexed rotated and side bent left T3 extended rotated and side bent right inhaled rib T5 extended rotated and side bent left L2 flexed rotated and side bent right Sacrum right on right       Assessment and Plan:  Scapular dyskinesis Has been doing relatively well.  No significant discomfort or pain at the moment.  We discussed icing regimen and home exercises, discussed which activities to do and which ones to avoid.  Increase activity slowly.  Discussed icing regimen.  Follow-up again in 6 to 12 weeks.    Nonallopathic problems  Decision today to treat with OMT was based on Physical Exam  After verbal consent patient was treated with HVLA, ME, FPR techniques in cervical, rib, thoracic, lumbar, and sacral  areas  Patient tolerated the procedure well with improvement in symptoms  Patient given exercises, stretches and lifestyle modifications  See medications in patient instructions if given  Patient will follow up in 4-8 weeks     The above documentation has been reviewed and is accurate and complete Melanie [name] M Melanie Raju, DO         Note: This dictation was prepared with Dragon dictation along with smaller phrase technology. Any transcriptional errors that result from this process are unintentional.

## 2024-06-01 ENCOUNTER — Encounter: Payer: Self-pay | Admitting: Family Medicine

## 2024-06-01 ENCOUNTER — Ambulatory Visit: Admitting: Family Medicine

## 2024-06-01 VITALS — BP 128/86 | HR 56 | Ht 62.0 in | Wt 134.0 lb

## 2024-06-01 DIAGNOSIS — M9901 Segmental and somatic dysfunction of cervical region: Secondary | ICD-10-CM

## 2024-06-01 DIAGNOSIS — M9902 Segmental and somatic dysfunction of thoracic region: Secondary | ICD-10-CM

## 2024-06-01 DIAGNOSIS — M9908 Segmental and somatic dysfunction of rib cage: Secondary | ICD-10-CM

## 2024-06-01 DIAGNOSIS — M9904 Segmental and somatic dysfunction of sacral region: Secondary | ICD-10-CM

## 2024-06-01 DIAGNOSIS — G2589 Other specified extrapyramidal and movement disorders: Secondary | ICD-10-CM

## 2024-06-01 DIAGNOSIS — M9903 Segmental and somatic dysfunction of lumbar region: Secondary | ICD-10-CM

## 2024-06-01 NOTE — Patient Instructions (Signed)
 Good to see you! Glad you're doing better See you again in 2-3 months

## 2024-06-01 NOTE — Assessment & Plan Note (Signed)
 Has been doing relatively well.  No significant discomfort or pain at the moment.  We discussed icing regimen and home exercises, discussed which activities to do and which ones to avoid.  Increase activity slowly.  Discussed icing regimen.  Follow-up again in 6 to 12 weeks.

## 2024-08-02 ENCOUNTER — Ambulatory Visit: Admitting: Family Medicine
